# Patient Record
Sex: Female | Born: 1980 | Race: White | Hispanic: No | Smoking: Current every day smoker
Health system: Southern US, Community
[De-identification: ages and names within clinical notes are randomized; demographics above are authoritative.]

## PROBLEM LIST (undated history)

## (undated) DIAGNOSIS — G8929 Other chronic pain: Secondary | ICD-10-CM

## (undated) DIAGNOSIS — T7840XA Allergy, unspecified, initial encounter: Secondary | ICD-10-CM

## (undated) DIAGNOSIS — I10 Essential (primary) hypertension: Secondary | ICD-10-CM

## (undated) DIAGNOSIS — F419 Anxiety disorder, unspecified: Secondary | ICD-10-CM

## (undated) DIAGNOSIS — R519 Headache, unspecified: Secondary | ICD-10-CM

## (undated) DIAGNOSIS — D649 Anemia, unspecified: Secondary | ICD-10-CM

## (undated) HISTORY — DX: Headache, unspecified: R51.9

## (undated) HISTORY — DX: Other chronic pain: G89.29

## (undated) HISTORY — DX: Anxiety disorder, unspecified: F41.9

## (undated) HISTORY — DX: Allergy, unspecified, initial encounter: T78.40XA

## (undated) HISTORY — PX: FRACTURE SURGERY: SHX138

## (undated) HISTORY — DX: Essential (primary) hypertension: I10

---
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
## 2011-05-08 ENCOUNTER — Encounter: Payer: Self-pay | Admitting: *Deleted

## 2011-05-08 ENCOUNTER — Emergency Department (HOSPITAL_COMMUNITY)
Admission: EM | Admit: 2011-05-08 | Discharge: 2011-05-08 | Disposition: A | Discharge status: Home / Self Care | Payer: Self-pay | Attending: Emergency Medicine | Admitting: Emergency Medicine

## 2011-05-08 DIAGNOSIS — H53149 Visual discomfort, unspecified: Secondary | ICD-10-CM | POA: Insufficient documentation

## 2011-05-08 DIAGNOSIS — R110 Nausea: Secondary | ICD-10-CM | POA: Insufficient documentation

## 2011-05-08 DIAGNOSIS — G43909 Migraine, unspecified, not intractable, without status migrainosus: Secondary | ICD-10-CM | POA: Insufficient documentation

## 2011-05-08 MED ORDER — DEXAMETHASONE SODIUM PHOSPHATE 10 MG/ML IJ SOLN: 10.0000 mg | Freq: Once | INTRAMUSCULAR | Status: DC

## 2011-05-08 MED ORDER — METOCLOPRAMIDE HCL 5 MG/ML IJ SOLN: 10.0000 mg | Freq: Once | INTRAMUSCULAR | Status: DC

## 2011-05-08 MED ORDER — OXYCODONE-ACETAMINOPHEN 5-325 MG PO TABS
1.0000 | ORAL_TABLET | Freq: Once | ORAL | Status: AC
  Administered 2011-05-08: 1 via ORAL
  Filled 2011-05-08: qty 1

## 2011-05-08 MED ORDER — DIPHENHYDRAMINE HCL 50 MG/ML IJ SOLN: 25.0000 mg | Freq: Once | INTRAMUSCULAR | Status: DC

## 2011-05-08 MED ORDER — KETOROLAC TROMETHAMINE 60 MG/2ML IM SOLN
60.0000 mg | Freq: Once | INTRAMUSCULAR | Status: AC
  Administered 2011-05-08: 60 mg via INTRAMUSCULAR
  Filled 2011-05-08 (×2): qty 2

## 2011-05-08 MED ORDER — SUMATRIPTAN SUCCINATE 100 MG PO TABS: ORAL_TABLET | ORAL | Status: AC

## 2011-05-08 MED ORDER — METOCLOPRAMIDE HCL 5 MG/ML IJ SOLN
10.0000 mg | Freq: Once | INTRAMUSCULAR | Status: AC
  Administered 2011-05-08: 10 mg via INTRAMUSCULAR
  Filled 2011-05-08: qty 2

## 2011-05-08 NOTE — ED Notes (Signed)
Pt up to use the bathroom. Gait was steady.

## 2011-05-08 NOTE — ED Notes (Signed)
Iv team not able to get iv access either. Asa Lente, PA made aware.

## 2011-05-08 NOTE — ED Notes (Signed)
Patient with right sided migraine headache for about 3 days.  Tried advil without any relief.  Last dose was about 4 hours ago

## 2011-05-08 NOTE — ED Provider Notes (Signed)
History     CSN: [id number]  Arrival date & time 05/08/11  1530   First MD Initiated Contact with Patient 05/08/11 1605      Chief Complaint  Patient presents with  . Migraine    (Consider location/radiation/quality/duration/timing/severity/associated sxs/prior treatment) Patient is a 30 y.o. female presenting with migraine. The history is provided by the patient.  Migraine This is a recurrent problem. The current episode started today. The problem occurs constantly. The problem has been unchanged. Associated symptoms include headaches and nausea. Pertinent negatives include no chills, fever or vomiting. Associated symptoms comments: She has a history of migraines that is similar to current headache, located over the right eye.. Exacerbated bySherlynn Stalls makes the headache worse.    History reviewed. No pertinent past medical history.  History reviewed. No pertinent past surgical history.  History reviewed. No pertinent family history.  History  Substance Use Topics  . Smoking status: Never Smoker   . Smokeless tobacco: Not on file  . Alcohol Use: No    OB History    Grav Para Term Preterm Abortions TAB SAB Ect Mult Living                  Review of Systems  Constitutional: Negative for fever and chills.  Eyes: Positive for photophobia.  Respiratory: Negative.   Cardiovascular: Negative.   Gastrointestinal: Positive for nausea. Negative for vomiting.  Musculoskeletal: Negative.   Skin: Negative.   Neurological: Positive for headaches.    Allergies  Review of patient's allergies indicates no known allergies.  Home Medications   Current Outpatient Rx  Name Route Sig Dispense Refill  . IBUPROFEN 200 MG PO TABS Oral Take 200 mg by mouth every 6 (six) hours as needed. For headache       BP 122/66  Pulse 106  Temp(Src) 98.2 F (36.8 C) (Oral)  Resp 2  SpO2 99%  Physical Exam  Constitutional: She is oriented to person, place, and time. She appears  well-developed and well-nourished.  HENT:  Head: Normocephalic.  Eyes: Pupils are equal, round, and reactive to light.  Neck: Normal range of motion. Neck supple.  Cardiovascular: Normal rate and regular rhythm.   Pulmonary/Chest: Effort normal and breath sounds normal.  Abdominal: Soft. Bowel sounds are normal. There is no tenderness. There is no rebound and no guarding.  Musculoskeletal: Normal range of motion.  Neurological: She is alert and oriented to person, place, and time. She has normal strength and normal reflexes. No cranial nerve deficit or sensory deficit. She displays a negative Romberg sign. Coordination normal.  Skin: Skin is warm and dry. No rash noted.  Psychiatric: She has a normal mood and affect.    ED Course  Procedures (including critical care time)  Labs Reviewed - No data to display No results found.   No diagnosis found.    MDM  Pain is no better with IM reglan and toradol. Percocet with relief of headache.        Rodena Medin, PA 05/08/11 [phone]

## 2011-05-08 NOTE — ED Provider Notes (Signed)
Medical screening examination/treatment/procedure(s) were performed by non-physician practitioner and as supervising physician I was immediately available for consultation/collaboration.   Andrew King, MD 05/08/11 2003 

## 2011-05-08 NOTE — ED Notes (Signed)
Paged iv team to come and start iv. Attempted x 2 without success.

## 2018-10-12 ENCOUNTER — Other Ambulatory Visit: Payer: Self-pay

## 2018-10-12 ENCOUNTER — Encounter (HOSPITAL_COMMUNITY): Payer: Self-pay | Admitting: Emergency Medicine

## 2018-10-12 ENCOUNTER — Emergency Department (HOSPITAL_COMMUNITY)
Admission: EM | Admit: 2018-10-12 | Discharge: 2018-10-12 | Disposition: A | Discharge status: Home / Self Care | Payer: Medicaid - Out of State | Attending: Emergency Medicine | Admitting: Emergency Medicine

## 2018-10-12 DIAGNOSIS — F17210 Nicotine dependence, cigarettes, uncomplicated: Secondary | ICD-10-CM | POA: Insufficient documentation

## 2018-10-12 DIAGNOSIS — N938 Other specified abnormal uterine and vaginal bleeding: Secondary | ICD-10-CM | POA: Diagnosis not present

## 2018-10-12 DIAGNOSIS — N939 Abnormal uterine and vaginal bleeding, unspecified: Secondary | ICD-10-CM | POA: Diagnosis present

## 2018-10-12 DIAGNOSIS — I10 Essential (primary) hypertension: Secondary | ICD-10-CM | POA: Insufficient documentation

## 2018-10-12 HISTORY — DX: Essential (primary) hypertension: I10

## 2018-10-12 LAB — URINALYSIS, ROUTINE W REFLEX MICROSCOPIC
Bacteria, UA: NONE SEEN
Bilirubin Urine: NEGATIVE
Glucose, UA: NEGATIVE mg/dL
Ketones, ur: NEGATIVE mg/dL
Leukocytes,Ua: NEGATIVE
Nitrite: NEGATIVE
Protein, ur: NEGATIVE mg/dL
Specific Gravity, Urine: 1.002 — ABNORMAL LOW (ref 1.005–1.030)
pH: 6 (ref 5.0–8.0)

## 2018-10-12 LAB — CBC WITH DIFFERENTIAL/PLATELET
Abs Immature Granulocytes: 0.02 10*3/uL (ref 0.00–0.07)
Basophils Absolute: 0 10*3/uL (ref 0.0–0.1)
Basophils Relative: 0 %
Eosinophils Absolute: 0.2 10*3/uL (ref 0.0–0.5)
Eosinophils Relative: 3 %
HCT: 34.4 % — ABNORMAL LOW (ref 36.0–46.0)
Hemoglobin: 10.8 g/dL — ABNORMAL LOW (ref 12.0–15.0)
Immature Granulocytes: 0 %
Lymphocytes Relative: 29 %
Lymphs Abs: 2.3 10*3/uL (ref 0.7–4.0)
MCH: 23.9 pg — ABNORMAL LOW (ref 26.0–34.0)
MCHC: 31.4 g/dL (ref 30.0–36.0)
MCV: 76.3 fL — ABNORMAL LOW (ref 80.0–100.0)
Monocytes Absolute: 0.6 10*3/uL (ref 0.1–1.0)
Monocytes Relative: 7 %
Neutro Abs: 4.8 10*3/uL (ref 1.7–7.7)
Neutrophils Relative %: 61 %
Platelets: 336 10*3/uL (ref 150–400)
RBC: 4.51 MIL/uL (ref 3.87–5.11)
RDW: 17.7 % — ABNORMAL HIGH (ref 11.5–15.5)
WBC: 7.9 10*3/uL (ref 4.0–10.5)
nRBC: 0 % (ref 0.0–0.2)

## 2018-10-12 LAB — PREGNANCY, URINE: Preg Test, Ur: NEGATIVE

## 2018-10-12 LAB — BASIC METABOLIC PANEL
Anion gap: 10 (ref 5–15)
BUN: 12 mg/dL (ref 6–20)
CO2: 25 mmol/L (ref 22–32)
Calcium: 8.9 mg/dL (ref 8.9–10.3)
Chloride: 102 mmol/L (ref 98–111)
Creatinine, Ser: 0.8 mg/dL (ref 0.44–1.00)
GFR calc Af Amer: >60 mL/min (ref >60)
GFR calc non Af Amer: >60 mL/min (ref >60)
Glucose, Bld: 95 mg/dL (ref 70–99)
Potassium: 4 mmol/L (ref 3.5–5.1)
Sodium: 137 mmol/L (ref 135–145)

## 2018-10-12 LAB — HCG, QUANTITATIVE, PREGNANCY: hCG, Beta Chain, Quant, S: <1 m[IU]/mL (ref <5)

## 2018-10-12 NOTE — Discharge Instructions (Addendum)
You were seen today for dysfunctional uterine bleeding.  Your hemoglobin was 10.2.  Continue to monitor your bleeding carefully.  If you have greater than 4 hours of bleeding through 1 pad per hour, develop dizziness, syncope, any new or worsening symptoms you need to be reevaluated.  Follow-up closely with your OB/GYN.  Continue iron.

## 2018-10-12 NOTE — ED Provider Notes (Signed)
[facility] EMERGENCY DEPARTMENT Provider Note   CSN: [id number] Arrival date & time: 10/12/18  0140    History   Chief Complaint Chief Complaint  Patient presents with  . Vaginal Bleeding    HPI Marissa Serrano is a 38 y.o. female.     HPI  This is a 38 year old female with a history of vaginal bleeding, anemia, fibroids who presents with vaginal bleeding.  Patient reports that she has had worsening vaginal bleeding since January 2019.  She has had multiple blood transfusions and surgery to remove fibroids.  Patient reports that she has been bleeding for the last 3 to 4 days.  Today she has noted large clots and is soaking a pad per hour.  She reports some dizziness and "seeing black spots."  She takes iron.  She is unsure of whether she may be pregnant.  She reports some crampy lower abdominal pain that radiates into her back.  Denies dysuria.  Rates her pain at 4 out of 10.  Past Medical History:  Diagnosis Date  . Anemia 1991  . Hypertension     There are no active problems to display for this patient.   History reviewed. No pertinent surgical history.   OB History   No obstetric history on file.      Home Medications    Prior to Admission medications   Medication Sig Start Date End Date Taking? Authorizing Provider  ibuprofen (ADVIL,MOTRIN) 200 MG tablet Take 200 mg by mouth every 6 (six) hours as needed. For headache     [provider]  SUMAtriptan (IMITREX) 100 MG tablet One tablet at onset of headache. May repeat x 1 in 2 hours if no relief. Maximum of 200 mg in 24 hours. 05/08/11   Elpidio Anis, PA-C    Family History History reviewed. No pertinent family history.  Social History Social History   Tobacco Use  . Smoking status: Current Every Day Smoker    Packs/day: 0.50    Years: 15.00    Pack years: 7.50    Types: Cigarettes  . Smokeless tobacco: Never Used  Substance Use Topics  . Alcohol use: No  . Drug use: Not Currently      Allergies   Patient has no known allergies.   Review of Systems Review of Systems  Constitutional: Negative for fever.  Eyes: Positive for visual disturbance.  Respiratory: Negative for shortness of breath.   Cardiovascular: Negative for chest pain.  Gastrointestinal: Positive for abdominal pain. Negative for nausea and vomiting.  Genitourinary: Positive for vaginal bleeding. Negative for dysuria.  Neurological: Positive for dizziness.  All other systems reviewed and are negative.    Physical Exam Updated Vital Signs BP (!) 143/83   Pulse 75   Temp 98.8 F (37.1 C) (Oral)   Resp 16   Ht 1.626 m (5\' 4" )   Wt 74.8 kg   LMP 10/12/2018   SpO2 99%   BMI 28.32 kg/m   Physical Exam Vitals signs and nursing note reviewed.  Constitutional:      Appearance: She is well-developed. She is not ill-appearing.  HENT:     Head: Normocephalic and atraumatic.     Mouth/Throat:     Mouth: Mucous membranes are moist.  Eyes:     Pupils: Pupils are equal, round, and reactive to light.  Neck:     Musculoskeletal: Neck supple.  Cardiovascular:     Rate and Rhythm: Normal rate and regular rhythm.  Pulmonary:     Effort:  Pulmonary effort is normal. No respiratory distress.  Abdominal:     General: Bowel sounds are normal.     Palpations: Abdomen is soft.     Tenderness: There is no abdominal tenderness. There is no guarding or rebound.  Genitourinary:    Comments: Normal external vaginal exam, moderate blood noted in the vaginal vault, no clots noted, cervical office closed Musculoskeletal:     Right lower leg: No edema.     Left lower leg: No edema.  Skin:    General: Skin is warm and dry.  Neurological:     Mental Status: She is alert and oriented to person, place, and time.  Psychiatric:        Mood and Affect: Mood normal.      ED Treatments / Results  Labs (all labs ordered are listed, but only abnormal results are displayed) Labs Reviewed  URINALYSIS, ROUTINE W  REFLEX MICROSCOPIC - Abnormal; Notable for the following components:      Result Value   Color, Urine COLORLESS (*)    Specific Gravity, Urine 1.002 (*)    Hgb urine dipstick LARGE (*)    All other components within normal limits  CBC WITH DIFFERENTIAL/PLATELET - Abnormal; Notable for the following components:   Hemoglobin 10.8 (*)    HCT 34.4 (*)    MCV 76.3 (*)    MCH 23.9 (*)    RDW 17.7 (*)    All other components within normal limits  PREGNANCY, URINE  BASIC METABOLIC PANEL  HCG, QUANTITATIVE, PREGNANCY    EKG None  Radiology No results found.  Procedures Procedures (including critical care time)  Medications Ordered in ED Medications - No data to display   Initial Impression / Assessment and Plan / ED Course  I have reviewed the triage vital signs and the nursing notes.  Pertinent labs & imaging results that were available during my care of the patient were reviewed by me and considered in my medical decision making (see chart for details).        Patient presents with vaginal bleeding.  Reports history of the same requiring blood transfusion.  She is overall nontoxic-appearing and vital signs are reassuring.  She is not orthostatic.  She has some bleeding on exam but no clots.  Hemoglobin today is 10.8.  She does not meet requirements for transfusion.  Otherwise her work-up is reassuring.  Recommend that she closely continue to monitor her bleeding.  Follow-up with OB/GYN on Monday.  Continue iron.  After history, exam, and medical workup I feel the patient has been appropriately medically screened and is safe for discharge home. Pertinent diagnoses were discussed with the patient. Patient was given return precautions.   Final Clinical Impressions(s) / ED Diagnoses   Final diagnoses:  Dysfunctional uterine bleeding    ED Discharge Orders    None       Shon BatonHorton, Ridhaan Dreibelbis F, MD 10/12/18 [phone]

## 2018-10-12 NOTE — ED Triage Notes (Signed)
Pt reports she has hx of fibroids and has had surgery for same, pt reports she has intermittent periods of vaginal bleeding since Jan 2019 and had had to have 2 blood transfusions due to severe vaginal bleeding, pt states she saturates 1 peri pad hourly x 4 hours with large clots, pt c/o mid lower abd pain that radiates into her back

## 2019-03-17 ENCOUNTER — Other Ambulatory Visit: Payer: Self-pay

## 2019-03-17 ENCOUNTER — Encounter (HOSPITAL_COMMUNITY): Payer: Self-pay

## 2019-03-17 ENCOUNTER — Emergency Department (HOSPITAL_COMMUNITY): Payer: Medicaid - Out of State

## 2019-03-17 ENCOUNTER — Emergency Department (HOSPITAL_COMMUNITY)
Admission: EM | Admit: 2019-03-17 | Discharge: 2019-03-17 | Disposition: A | Discharge status: Home / Self Care | Payer: Medicaid - Out of State | Attending: Emergency Medicine | Admitting: Emergency Medicine

## 2019-03-17 DIAGNOSIS — I10 Essential (primary) hypertension: Secondary | ICD-10-CM | POA: Insufficient documentation

## 2019-03-17 DIAGNOSIS — F17210 Nicotine dependence, cigarettes, uncomplicated: Secondary | ICD-10-CM | POA: Diagnosis not present

## 2019-03-17 DIAGNOSIS — R072 Precordial pain: Secondary | ICD-10-CM | POA: Diagnosis not present

## 2019-03-17 DIAGNOSIS — R0789 Other chest pain: Secondary | ICD-10-CM | POA: Diagnosis present

## 2019-03-17 LAB — BASIC METABOLIC PANEL
Anion gap: 11 (ref 5–15)
BUN: 10 mg/dL (ref 6–20)
CO2: 24 mmol/L (ref 22–32)
Calcium: 9 mg/dL (ref 8.9–10.3)
Chloride: 104 mmol/L (ref 98–111)
Creatinine, Ser: 0.68 mg/dL (ref 0.44–1.00)
GFR calc Af Amer: >60 mL/min (ref >60)
GFR calc non Af Amer: >60 mL/min (ref >60)
Glucose, Bld: 88 mg/dL (ref 70–99)
Potassium: 3.6 mmol/L (ref 3.5–5.1)
Sodium: 139 mmol/L (ref 135–145)

## 2019-03-17 LAB — I-STAT BETA HCG BLOOD, ED (MC, WL, AP ONLY): I-stat hCG, quantitative: <5 m[IU]/mL (ref <5)

## 2019-03-17 LAB — TROPONIN I (HIGH SENSITIVITY)
Troponin I (High Sensitivity): 2 ng/L (ref <18)
Troponin I (High Sensitivity): 2 ng/L (ref <18)

## 2019-03-17 LAB — CBC
HCT: 35.9 % — ABNORMAL LOW (ref 36.0–46.0)
Hemoglobin: 10.9 g/dL — ABNORMAL LOW (ref 12.0–15.0)
MCH: 24.3 pg — ABNORMAL LOW (ref 26.0–34.0)
MCHC: 30.4 g/dL (ref 30.0–36.0)
MCV: 80 fL (ref 80.0–100.0)
Platelets: 368 10*3/uL (ref 150–400)
RBC: 4.49 MIL/uL (ref 3.87–5.11)
RDW: 17.5 % — ABNORMAL HIGH (ref 11.5–15.5)
WBC: 9.1 10*3/uL (ref 4.0–10.5)
nRBC: 0 % (ref 0.0–0.2)

## 2019-03-17 LAB — TSH: TSH: 2.328 u[IU]/mL (ref 0.350–4.500)

## 2019-03-17 MED ORDER — ASPIRIN 81 MG PO CHEW: 324.0000 mg | CHEWABLE_TABLET | Freq: Once | ORAL | Status: DC

## 2019-03-17 MED ORDER — LISINOPRIL 10 MG PO TABS: 10.0000 mg | ORAL_TABLET | Freq: Every day | ORAL | 0 refills | Status: DC

## 2019-03-17 NOTE — ED Notes (Signed)
Lab in room.

## 2019-03-17 NOTE — ED Notes (Addendum)
Pt did not want ntg given by ems due to migraine hx

## 2019-03-17 NOTE — ED Provider Notes (Signed)
[facility] EMERGENCY DEPARTMENT Provider Note   CSN: [id number] Arrival date & time: 03/17/19  1957     History   Chief Complaint Chief Complaint  Patient presents with  . Chest Pain    HPI Marissa Serrano is a 38 y.o. female who presents emergency department chief complaint of chest pressure and shortness of breath.  Patient has a past medical history of chronic anemia.  She states that she has had it since she would about 38 years old.  She has a history of persistent ice pica.  Patient says that over the past 4 days she has had a sensation of heavy chest pressure, feeling short of breath which is worse with exertion.  She states that she was recently diagnosed with bleeding ovarian cyst and had heavy blood loss.  She had one of her cysts removed in [place] and is scheduled for another one.  She denies unilateral leg swelling.  She is a daily smoker.  She denies use of exogenous estrogens, recent confinement or injury.  She has no history of primary relative with MI.  She denies feelings of lightheadedness, orthopnea or PND.  She denies pleuritic chest pain.  She was seen at an urgent care and found to have T wave inversion was given baby aspirin and sent by EMS to be evaluated here.  She has also noted that her blood pressure has been elevated ever since she started having problems with her ovarian cyst.  Denies fevers, chills, cough.     HPI  Past Medical History:  Diagnosis Date  . Anemia 1991  . Hypertension     There are no active problems to display for this patient.   History reviewed. No pertinent surgical history.   OB History   No obstetric history on file.      Home Medications    Prior to Admission medications   Medication Sig Start Date End Date Taking? Authorizing Provider  ibuprofen (ADVIL,MOTRIN) 200 MG tablet Take 200 mg by mouth every 6 (six) hours as needed. For headache     [provider]  SUMAtriptan (IMITREX) 100 MG tablet One tablet at  onset of headache. May repeat x 1 in 2 hours if no relief. Maximum of 200 mg in 24 hours. 05/08/11   Charlann Lange, PA-C    Family History History reviewed. No pertinent family history.  Social History Social History   Tobacco Use  . Smoking status: Current Every Day Smoker    Packs/day: 0.50    Years: 15.00    Pack years: 7.50    Types: Cigarettes  . Smokeless tobacco: Never Used  Substance Use Topics  . Alcohol use: No  . Drug use: Not Currently     Allergies   Patient has no known allergies.   Review of Systems Review of Systems Ten systems reviewed and are negative for acute change, except as noted in the HPI.    Physical Exam Updated Vital Signs BP (!) 160/102   Pulse 71   Temp 99 F (37.2 C) (Oral)   Resp 19   Ht 5\' 4"  (1.626 m)   Wt 74.4 kg   LMP 02/20/2019 (Exact Date)   SpO2 100%   BMI 28.15 kg/m   Physical Exam Vitals signs and nursing note reviewed.  Constitutional:      General: She is not in acute distress.    Appearance: She is well-developed. She is not diaphoretic.  HENT:     Head: Normocephalic and atraumatic.  Eyes:     General: No scleral icterus.    Conjunctiva/sclera: Conjunctivae normal.  Neck:     Musculoskeletal: Normal range of motion.     Vascular: No JVD.  Cardiovascular:     Rate and Rhythm: Normal rate and regular rhythm.     Heart sounds: Normal heart sounds. No murmur. No friction rub. No gallop.   Pulmonary:     Effort: Pulmonary effort is normal. No respiratory distress.     Breath sounds: Normal breath sounds.  Abdominal:     General: Bowel sounds are normal. There is no distension.     Palpations: Abdomen is soft. There is no mass.     Tenderness: There is no abdominal tenderness. There is no guarding.  Musculoskeletal:     Right lower leg: No edema.     Left lower leg: No edema.  Skin:    General: Skin is warm and dry.  Neurological:     Mental Status: She is alert and oriented to person, place, and time.   Psychiatric:        Behavior: Behavior normal.      ED Treatments / Results  Labs (all labs ordered are listed, but only abnormal results are displayed) Labs Reviewed  BASIC METABOLIC PANEL  CBC  I-STAT BETA HCG BLOOD, ED ([facility], [facility], [facility] ONLY)  TROPONIN I (HIGH SENSITIVITY)    EKG EKG Interpretation  Date/Time:  Tuesday March 17 2019 20:02:58 EST Ventricular Rate:  70 PR Interval:    QRS Duration: 86 QT Interval:  395 QTC Calculation: 427 R Axis:   72 Text Interpretation: Sinus rhythm Normal ECG Confirmed by Eber Hong ([id number]) on 03/17/2019 8:40:34 PM   Radiology No results found.  Procedures Procedures (including critical care time)  Medications Ordered in ED Medications - No data to display   Initial Impression / Assessment and Plan / ED Course  I have reviewed the triage vital signs and the nursing notes.  Pertinent labs & imaging results that were available during my care of the patient were reviewed by me and considered in my medical decision making (see chart for details).        38 year old female here with complaint of chest pressure and shortness of breath.  She is PERC negative. Differential diagnosis includes hypertension, cardiomyopathy.  I have low suspicion for ACS and her heart score still pending given her initial value of troponin.  Personally reviewed the patient's 1 view chest x-ray which appears within normal limits, without evidence of cardiomegaly or edema my interpretation.  Patient also denies orthopnea or PND suggestive of CHF diagnosis.  She has a TSH pending to rule out hyperthyroidism although she does not have tachycardia here.  She has no previous history of hypertension states that this just began when she started having her ovarian cyst issues.  Patient EKG shows normal sinus rhythm at a rate of 70 without any evidence of ischemic change.  I have given sign out PA Triplett who will assume care of the patient  Final Clinical  Impressions(s) / ED Diagnoses   Final diagnoses:  None    ED Discharge Orders    None       Arthor Captain, PA-C 03/17/19 2202    Eber Hong, MD 03/18/19 1501

## 2019-03-17 NOTE — ED Notes (Signed)
Called lab regarding bloodwork

## 2019-03-17 NOTE — ED Provider Notes (Signed)
   38 year old with 4-day history of chest heaviness and pressure that has been constant since onset.  Cardiac work-up pending and patient signed out to me by Margarita Mail, PA-C at end of shift.   Initial troponin is reassuring as well as TSH.  Chest x-ray shows no evidence of cardiomegaly.  Vital signs reviewed.  She is PERC negative and has been without tachypnea tachycardia or hypoxia while here.  She has been hypertensive and endorses history of this for approximately 1 year after a diagnosis of ovarian cyst issues   Labs Reviewed  CBC - Abnormal; Notable for the following components:      Result Value   Hemoglobin 10.9 (*)    HCT 35.9 (*)    MCH 24.3 (*)    RDW 17.5 (*)    All other components within normal limits  BASIC METABOLIC PANEL  TSH  I-STAT BETA HCG BLOOD, ED ([facility], [facility], [facility] ONLY)  TROPONIN I (HIGH SENSITIVITY)  TROPONIN I (HIGH SENSITIVITY)   Delta trop is unchanged.    HEART score of 2.  Pt agrees to d/c home.  I will provide f/u referral info regarding her HTN and start her on an ACE inhibitor.  she agrees to plan.  Advised her of side effect risks and she verbalized Therisa Doyne, PA-C 03/17/19 2333    Noemi Chapel, MD 03/18/19 1501

## 2019-03-17 NOTE — ED Triage Notes (Signed)
Ems gave 3 baby aspirin in route.

## 2019-03-17 NOTE — Discharge Instructions (Addendum)
Your work-up this evening was reassuring.  I have provided local clinic for you to establish primary care regarding your hypertension.  Return to the ER for any worsening symptoms.

## 2019-03-17 NOTE — ED Triage Notes (Addendum)
Pt sent from [facility] for chest pain x4 days. Stated there was a "t wave abnormality"  EMS said that she has an inverted t wave on their monitor.  8/10 center chest  Pt also has hx of htn .

## 2020-07-11 ENCOUNTER — Ambulatory Visit: Payer: Self-pay | Admitting: Family Medicine

## 2021-03-07 ENCOUNTER — Encounter: Payer: Self-pay | Admitting: Family Medicine

## 2021-03-07 ENCOUNTER — Other Ambulatory Visit: Payer: Self-pay

## 2021-03-07 ENCOUNTER — Ambulatory Visit (INDEPENDENT_AMBULATORY_CARE_PROVIDER_SITE_OTHER): Payer: Managed Care, Other (non HMO) | Admitting: Family Medicine

## 2021-03-07 VITALS — BP 162/100 | HR 76 | Temp 97.7°F | Ht 64.0 in | Wt 166.0 lb

## 2021-03-07 DIAGNOSIS — I10 Essential (primary) hypertension: Secondary | ICD-10-CM

## 2021-03-07 DIAGNOSIS — Z6828 Body mass index (BMI) 28.0-28.9, adult: Secondary | ICD-10-CM

## 2021-03-07 DIAGNOSIS — F339 Major depressive disorder, recurrent, unspecified: Secondary | ICD-10-CM

## 2021-03-07 MED ORDER — LISINOPRIL 40 MG PO TABS: 40.0000 mg | ORAL_TABLET | Freq: Every day | ORAL | 3 refills | Status: DC

## 2021-03-07 MED ORDER — CLONIDINE HCL 0.1 MG PO TABS
0.1000 mg | ORAL_TABLET | Freq: Once | ORAL | Status: AC
  Administered 2021-03-07: 0.1 mg via ORAL

## 2021-03-07 MED ORDER — CLONIDINE HCL 0.1 MG PO TABS: 0.1000 mg | ORAL_TABLET | Freq: Three times a day (TID) | ORAL | 3 refills | Status: AC | PRN

## 2021-03-07 MED ORDER — FLUOXETINE HCL 20 MG PO CAPS: 20.0000 mg | ORAL_CAPSULE | Freq: Every day | ORAL | 3 refills | Status: AC

## 2021-03-07 NOTE — Patient Instructions (Addendum)
If your symptoms worsen or you have thoughts of suicide/homicide, PLEASE SEEK IMMEDIATE MEDICAL ATTENTION.  You may always call the National Suicide Hotline.  This is available 24 hours a day, 7 days a week.  Their number is: 203-223-6940  Taking the medicine as directed and not missing any doses is one of the best things you can do to treat your depression.  Here are some things to keep in mind:  Side effects (stomach upset, some increased anxiety) may happen before you notice a benefit.  These side effects typically go away over time. Changes to your dose of medicine or a change in medication all together is sometimes necessary Most people need to be on medication at least 12 months Many people will notice an improvement within two weeks but the full effect of the medication can take up to 4-6 weeks Stopping the medication when you start feeling better often results in a return of symptoms Never discontinue your medication without contacting a health care professional first.  Some medications require gradual discontinuation/ taper and can make you sick if you stop them abruptly.  If your symptoms worsen or you have thoughts of suicide/homicide, PLEASE SEEK IMMEDIATE MEDICAL ATTENTION.  You may always call:  National Suicide Hotline: 724 185 9284 [place] Crisis Line: 5861792337 Crisis Recovery in [place]: 909-311-5016  These are available 24 hours a day, 7 days a week.   Goal BP:  For patients younger than 60: Goal BP < 140/90. For patients 60 and older: Goal BP < 150/90. For patients with diabetes: Goal BP < 140/90.  Take your medications faithfully as prescribed. Maintain a healthy weight. Get at least 150 minutes of aerobic exercise per week. Minimize salt intake, less than 2000 mg per day. Minimize alcohol intake.  DASH Eating Plan DASH stands for "Dietary Approaches to Stop Hypertension." The DASH eating plan is a healthy eating plan that has been shown to  reduce high blood pressure (hypertension). Additional health benefits may include reducing the risk of type 2 diabetes mellitus, heart disease, and stroke. The DASH eating plan may also help with weight loss.  WHAT DO I NEED TO KNOW ABOUT THE DASH EATING PLAN? For the DASH eating plan, you will follow these general guidelines: Choose foods with a percent daily value for sodium of less than 5% (as listed on the food label). Use salt-free seasonings or herbs instead of table salt or sea salt. Check with your health care provider or pharmacist before using salt substitutes. Eat lower-sodium products, often labeled as "lower sodium" or "no salt added." Eat fresh foods. Eat more vegetables, fruits, and low-fat dairy products. Choose whole grains. Look for the word "whole" as the first word in the ingredient list. Choose fish and skinless chicken or [place] more often than red meat. Limit fish, poultry, and meat to 6 oz (170 g) each day. Limit sweets, desserts, sugars, and sugary drinks. Choose heart-healthy fats. Limit cheese to 1 oz (28 g) per day. Eat more home-cooked food and less restaurant, buffet, and fast food. Limit fried foods. Cook foods using methods other than frying. Limit canned vegetables. If you do use them, rinse them well to decrease the sodium. When eating at a restaurant, ask that your food be prepared with less salt, or no salt if possible.  WHAT FOODS CAN I EAT? Seek help from a dietitian for individual calorie needs.  Grains Whole grain or whole wheat bread. Brown rice. Whole grain or whole wheat pasta. Quinoa, bulgur, and whole  grain cereals. Low-sodium cereals. Corn or whole wheat flour tortillas. Whole grain cornbread. Whole grain crackers. Low-sodium crackers.  Vegetables Fresh or frozen vegetables (raw, steamed, roasted, or grilled). Low-sodium or reduced-sodium tomato and vegetable juices. Low-sodium or reduced-sodium tomato sauce and paste. Low-sodium or  reduced-sodium canned vegetables.   Fruits All fresh, canned (in natural juice), or frozen fruits.  Meat and Other Protein Products Ground beef (85% or leaner), grass-fed beef, or beef trimmed of fat. Skinless chicken or [place]. Ground chicken or [place]. Pork trimmed of fat. All fish and seafood. Eggs. Dried beans, peas, or lentils. Unsalted nuts and seeds. Unsalted canned beans.  Dairy Low-fat dairy products, such as skim or 1% milk, 2% or reduced-fat cheeses, low-fat ricotta or cottage cheese, or plain low-fat yogurt. Low-sodium or reduced-sodium cheeses.  Fats and Oils Tub margarines without trans fats. Light or reduced-fat mayonnaise and salad dressings (reduced sodium). Avocado. Safflower, olive, or canola oils. Natural peanut or almond butter.  Other Unsalted popcorn and pretzels. The items listed above may not be a complete list of recommended foods or beverages. Contact your dietitian for more options.  WHAT FOODS ARE NOT RECOMMENDED?  Grains White bread. White pasta. White rice. Refined cornbread. Bagels and croissants. Crackers that contain trans fat.  Vegetables Creamed or fried vegetables. Vegetables in a cheese sauce. Regular canned vegetables. Regular canned tomato sauce and paste. Regular tomato and vegetable juices.  Fruits Dried fruits. Canned fruit in light or heavy syrup. Fruit juice.  Meat and Other Protein Products Fatty cuts of meat. Ribs, chicken wings, bacon, sausage, bologna, salami, chitterlings, fatback, hot dogs, bratwurst, and packaged luncheon meats. Salted nuts and seeds. Canned beans with salt.  Dairy Whole or 2% milk, cream, half-and-half, and cream cheese. Whole-fat or sweetened yogurt. Full-fat cheeses or blue cheese. Nondairy creamers and whipped toppings. Processed cheese, cheese spreads, or cheese curds.  Condiments Onion and garlic salt, seasoned salt, table salt, and sea salt. Canned and packaged gravies. Worcestershire sauce. Tartar sauce.  Barbecue sauce. Teriyaki sauce. Soy sauce, including reduced sodium. Steak sauce. Fish sauce. Oyster sauce. Cocktail sauce. Horseradish. Ketchup and mustard. Meat flavorings and tenderizers. Bouillon cubes. Hot sauce. Tabasco sauce. Marinades. Taco seasonings. Relishes.  Fats and Oils Butter, stick margarine, lard, shortening, ghee, and bacon fat. Coconut, palm kernel, or palm oils. Regular salad dressings.  Other Pickles and olives. Salted popcorn and pretzels.  The items listed above may not be a complete list of foods and beverages to avoid. Contact your dietitian for more information.  WHERE CAN I FIND MORE INFORMATION? National Heart, Lung, and Blood Institute: [email or web address] Document Released: 04/19/2011 Document Revised: 09/14/2013 Document Reviewed: 03/04/2013 [facility] Patient Information 2015 [place], [place]. This information is not intended to replace advice given to you by your health care provider. Make sure you discuss any questions you have with your health care provider.   I think that you would greatly benefit from seeing a nutritionist.  If you are interested, please call Dr. Gerilyn Pilgrim at [phone] to schedule an appointment.
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
## 2021-03-08 LAB — CBC WITH DIFFERENTIAL/PLATELET
Basophils Absolute: 0 10*3/uL (ref 0.0–0.2)
Basos: 1 %
EOS (ABSOLUTE): 0.1 10*3/uL (ref 0.0–0.4)
Eos: 2 %
Hematocrit: 43.4 % (ref 34.0–46.6)
Hemoglobin: 13.7 g/dL (ref 11.1–15.9)
Immature Grans (Abs): 0 10*3/uL (ref 0.0–0.1)
Immature Granulocytes: 0 %
Lymphocytes Absolute: 2.2 10*3/uL (ref 0.7–3.1)
Lymphs: 32 %
MCH: 26.4 pg — ABNORMAL LOW (ref 26.6–33.0)
MCHC: 31.6 g/dL (ref 31.5–35.7)
MCV: 84 fL (ref 79–97)
Monocytes Absolute: 0.5 10*3/uL (ref 0.1–0.9)
Monocytes: 7 %
Neutrophils Absolute: 3.9 10*3/uL (ref 1.4–7.0)
Neutrophils: 58 %
Platelets: 345 10*3/uL (ref 150–450)
RBC: 5.18 x10E6/uL (ref 3.77–5.28)
RDW: 16.2 % — ABNORMAL HIGH (ref 11.7–15.4)
WBC: 6.6 10*3/uL (ref 3.4–10.8)

## 2021-03-08 LAB — LIPID PANEL
Chol/HDL Ratio: 4.3 ratio (ref 0.0–4.4)
Cholesterol, Total: 208 mg/dL — ABNORMAL HIGH (ref 100–199)
HDL: 48 mg/dL (ref >39)
LDL Chol Calc (NIH): 138 mg/dL — ABNORMAL HIGH (ref 0–99)
Triglycerides: 124 mg/dL (ref 0–149)
VLDL Cholesterol Cal: 22 mg/dL (ref 5–40)

## 2021-03-08 LAB — CMP14+EGFR
ALT: 15 IU/L (ref 0–32)
AST: 19 IU/L (ref 0–40)
Albumin/Globulin Ratio: 1.8 (ref 1.2–2.2)
Albumin: 4.6 g/dL (ref 3.8–4.8)
Alkaline Phosphatase: 89 IU/L (ref 44–121)
BUN/Creatinine Ratio: 9 (ref 9–23)
BUN: 8 mg/dL (ref 6–20)
Bilirubin Total: 0.4 mg/dL (ref 0.0–1.2)
CO2: 22 mmol/L (ref 20–29)
Calcium: 9.5 mg/dL (ref 8.7–10.2)
Chloride: 101 mmol/L (ref 96–106)
Creatinine, Ser: 0.86 mg/dL (ref 0.57–1.00)
Globulin, Total: 2.6 g/dL (ref 1.5–4.5)
Glucose: 91 mg/dL (ref 70–99)
Potassium: 4.1 mmol/L (ref 3.5–5.2)
Sodium: 138 mmol/L (ref 134–144)
Total Protein: 7.2 g/dL (ref 6.0–8.5)
eGFR: 89 mL/min/{1.73_m2} (ref >59)

## 2021-03-08 LAB — MICROALBUMIN / CREATININE URINE RATIO
Creatinine, Urine: 443.6 mg/dL
Microalb/Creat Ratio: 17 mg/g creat (ref 0–29)
Microalbumin, Urine: 75.6 ug/mL

## 2021-03-08 LAB — THYROID PANEL WITH TSH
Free Thyroxine Index: 3 (ref 1.2–4.9)
T3 Uptake Ratio: 26 % (ref 24–39)
T4, Total: 11.5 ug/dL (ref 4.5–12.0)
TSH: 1.67 u[IU]/mL (ref 0.450–4.500)

## 2021-03-21 ENCOUNTER — Other Ambulatory Visit: Payer: Self-pay

## 2021-03-21 ENCOUNTER — Ambulatory Visit (INDEPENDENT_AMBULATORY_CARE_PROVIDER_SITE_OTHER): Payer: Managed Care, Other (non HMO) | Admitting: Family Medicine

## 2021-03-21 ENCOUNTER — Encounter: Payer: Self-pay | Admitting: Family Medicine

## 2021-03-21 VITALS — BP 167/119 | HR 81 | Temp 97.6°F | Ht 64.0 in | Wt 170.0 lb

## 2021-03-21 DIAGNOSIS — B351 Tinea unguium: Secondary | ICD-10-CM | POA: Diagnosis not present

## 2021-03-21 DIAGNOSIS — I10 Essential (primary) hypertension: Secondary | ICD-10-CM

## 2021-03-21 MED ORDER — AMLODIPINE BESYLATE 5 MG PO TABS: 5.0000 mg | ORAL_TABLET | Freq: Every day | ORAL | 1 refills | Status: DC

## 2021-03-21 MED ORDER — TERBINAFINE HCL 250 MG PO TABS: 250.0000 mg | ORAL_TABLET | Freq: Every day | ORAL | 0 refills | Status: DC

## 2021-03-21 NOTE — Patient Instructions (Signed)

## 2021-03-21 NOTE — Progress Notes (Signed)
Subjective:  Patient ID: Marissa Serrano, female    DOB: [birth year], 40 y.o.   MRN: [id number]  Patient Care Team: Baruch Gouty, FNP as PCP - General (Family Medicine) Patient, No Pcp Per (Inactive) (General Practice)   Chief Complaint:  Hypertension   HPI: Marissa Serrano is a 40 y.o. female presenting on 03/21/2021 for Hypertension   Pt presents today for follow up of uncontrolled hypertension. She has been taking lisinopril 40 mg and HCTZ 25 mg daily. If BP is above 150/90 she will take 0.1 mg clonidine. She states she has taken the clonidine at least 5 times since prescribed. BP log reveals readings above goal on a consistent basis. She also reports discoloration of her right great toenail for several months. Has tried over the counter antifungals without relief of symptoms.   Hypertension This is a recurrent problem. The current episode started more than 1 month ago. The problem has been waxing and waning since onset. The problem is uncontrolled. Associated symptoms include anxiety and headaches. Pertinent negatives include no blurred vision, chest pain, malaise/fatigue, neck pain, orthopnea, palpitations, peripheral edema, PND, shortness of breath or sweats. Agents associated with hypertension include NSAIDs. Risk factors for coronary artery disease include stress and smoking/tobacco exposure. Past treatments include ACE inhibitors and diuretics. The current treatment provides mild improvement. Compliance problems include diet.     Relevant past medical, surgical, family, and social history reviewed and updated as indicated.  Allergies and medications reviewed and updated. Data reviewed: Chart in Epic.   Past Medical History:  Diagnosis Date   Allergy    Anemia 1991   Anxiety    Chronic headaches    Hypertension     Past Surgical History:  Procedure Laterality Date   FRACTURE SURGERY      Social History   Socioeconomic History   Marital status: Legally  Separated    Spouse name: Not on file   Number of children: Not on file   Years of education: Not on file   Highest education level: Not on file  Occupational History   Not on file  Tobacco Use   Smoking status: Every Day    Packs/day: 0.50    Years: 15.00    Pack years: 7.50    Types: Cigarettes   Smokeless tobacco: Never  Vaping Use   Vaping Use: Never used  Substance and Sexual Activity   Alcohol use: No   Drug use: Not Currently   Sexual activity: Not Currently  Other Topics Concern   Not on file  Social History Narrative   ** Merged History Encounter **       Social Determinants of Health   Financial Resource Strain: Not on file  Food Insecurity: Not on file  Transportation Needs: Not on file  Physical Activity: Not on file  Stress: Not on file  Social Connections: Not on file  Intimate Partner Violence: Not on file    Outpatient Encounter Medications as of 03/21/2021  Medication Sig   amLODipine (NORVASC) 5 MG tablet Take 1 tablet (5 mg total) by mouth at bedtime.   cloNIDine (CATAPRES) 0.1 MG tablet Take 1 tablet (0.1 mg total) by mouth 3 (three) times daily as needed. Three times daily as needed for BP over 150/90   FLUoxetine (PROZAC) 20 MG capsule Take 1 capsule (20 mg total) by mouth daily.   hydrochlorothiazide (HYDRODIURIL) 25 MG tablet Take 25 mg by mouth daily.   lisinopril (ZESTRIL) 40 MG tablet  Take 1 tablet (40 mg total) by mouth daily.   SUMAtriptan (IMITREX) 100 MG tablet One tablet at onset of headache. May repeat x 1 in 2 hours if no relief. Maximum of 200 mg in 24 hours.   terbinafine (LAMISIL) 250 MG tablet Take 1 tablet (250 mg total) by mouth daily.   [DISCONTINUED] ibuprofen (ADVIL,MOTRIN) 200 MG tablet Take 200 mg by mouth every 6 (six) hours as needed. For headache    [DISCONTINUED] lisinopril (ZESTRIL) 10 MG tablet Take 1 tablet (10 mg total) by mouth daily.   No facility-administered encounter medications on file as of 03/21/2021.    No  Known Allergies  Review of Systems  Constitutional:  Positive for activity change, appetite change and fatigue. Negative for chills, diaphoresis, fever, malaise/fatigue and unexpected weight change.  HENT: Negative.    Eyes: Negative.  Negative for blurred vision.  Respiratory:  Negative for cough, chest tightness and shortness of breath.   Cardiovascular:  Negative for chest pain, palpitations, orthopnea, leg swelling and PND.  Gastrointestinal:  Negative for abdominal pain, blood in stool, constipation, diarrhea, nausea and vomiting.  Endocrine: Negative.   Genitourinary:  Negative for decreased urine volume, difficulty urinating, dysuria, frequency and urgency.  Musculoskeletal:  Negative for arthralgias, myalgias and neck pain.  Skin: Negative.   Allergic/Immunologic: Negative.   Neurological:  Positive for headaches. Negative for dizziness, tremors, seizures, syncope, facial asymmetry, speech difficulty, weakness, light-headedness and numbness.  Hematological: Negative.   Psychiatric/Behavioral:  Positive for agitation, decreased concentration, dysphoric mood and sleep disturbance. Negative for behavioral problems, confusion, hallucinations, self-injury and suicidal ideas. The patient is nervous/anxious. The patient is not hyperactive.   All other systems reviewed and are negative.      Objective:  BP (!) 167/119   Pulse 81   Temp 97.6 F (36.4 C)   Ht 5' 4" (1.626 m)   Wt 170 lb (77.1 kg)   LMP 02/18/2021 (Approximate)   SpO2 100%   BMI 29.18 kg/m    Wt Readings from Last 3 Encounters:  03/21/21 170 lb (77.1 kg)  03/07/21 166 lb (75.3 kg)  03/17/19 164 lb (74.4 kg)    Physical Exam Vitals and nursing note reviewed.  Constitutional:      General: She is not in acute distress.    Appearance: Normal appearance. She is well-developed, well-groomed and overweight. She is not ill-appearing, toxic-appearing or diaphoretic.  HENT:     Head: Normocephalic and atraumatic.      Jaw: There is normal jaw occlusion.     Right Ear: Hearing normal.     Left Ear: Hearing normal.     Nose: Nose normal.     Mouth/Throat:     Lips: Pink.     Mouth: Mucous membranes are moist.     Pharynx: Oropharynx is clear. Uvula midline.  Eyes:     General: Lids are normal.     Extraocular Movements: Extraocular movements intact.     Conjunctiva/sclera: Conjunctivae normal.     Pupils: Pupils are equal, round, and reactive to light.  Neck:     Thyroid: No thyroid mass, thyromegaly or thyroid tenderness.     Vascular: No carotid bruit or JVD.     Trachea: Trachea and phonation normal.  Cardiovascular:     Rate and Rhythm: Normal rate and regular rhythm.     Chest Wall: PMI is not displaced.     Pulses: Normal pulses.     Heart sounds: Normal heart sounds. No murmur heard.  No friction rub. No gallop.  Pulmonary:     Effort: Pulmonary effort is normal. No respiratory distress.     Breath sounds: Normal breath sounds. No wheezing.  Abdominal:     General: Bowel sounds are normal. There is no distension or abdominal bruit.     Palpations: Abdomen is soft. There is no hepatomegaly or splenomegaly.     Tenderness: There is no abdominal tenderness. There is no right CVA tenderness or left CVA tenderness.     Hernia: No hernia is present.  Musculoskeletal:        General: Normal range of motion.     Cervical back: Normal range of motion and neck supple.     Right lower leg: No edema.     Left lower leg: No edema.  Feet:     Right foot:     Skin integrity: Skin integrity normal.     Toenail Condition: Right toenails are abnormally thick. Fungal disease present.    Comments: Rigth great toenail abnormally thick and has yellow discoloration Lymphadenopathy:     Cervical: No cervical adenopathy.  Skin:    General: Skin is warm and dry.     Capillary Refill: Capillary refill takes less than 2 seconds.     Coloration: Skin is not cyanotic, jaundiced or pale.     Findings: No  rash.  Neurological:     General: No focal deficit present.     Mental Status: She is alert and oriented to person, place, and time.     Sensory: Sensation is intact.     Motor: Motor function is intact.     Coordination: Coordination is intact.     Gait: Gait is intact.     Deep Tendon Reflexes: Reflexes are normal and symmetric.  Psychiatric:        Attention and Perception: Attention and perception normal.        Mood and Affect: Mood and affect normal.        Speech: Speech normal.        Behavior: Behavior normal. Behavior is cooperative.        Thought Content: Thought content normal.        Cognition and Memory: Cognition and memory normal.        Judgment: Judgment normal.    Results for orders placed or performed in visit on 03/07/21  CMP14+EGFR  Result Value Ref Range   Glucose 91 70 - 99 mg/dL   BUN 8 6 - 20 mg/dL   Creatinine, Ser 0.86 0.57 - 1.00 mg/dL   eGFR 89 >59 mL/min/1.73   BUN/Creatinine Ratio 9 9 - 23   Sodium 138 134 - 144 mmol/L   Potassium 4.1 3.5 - 5.2 mmol/L   Chloride 101 96 - 106 mmol/L   CO2 22 20 - 29 mmol/L   Calcium 9.5 8.7 - 10.2 mg/dL   Total Protein 7.2 6.0 - 8.5 g/dL   Albumin 4.6 3.8 - 4.8 g/dL   Globulin, Total 2.6 1.5 - 4.5 g/dL   Albumin/Globulin Ratio 1.8 1.2 - 2.2   Bilirubin Total 0.4 0.0 - 1.2 mg/dL   Alkaline Phosphatase 89 44 - 121 IU/L   AST 19 0 - 40 IU/L   ALT 15 0 - 32 IU/L  CBC with Differential/Platelet  Result Value Ref Range   WBC 6.6 3.4 - 10.8 x10E3/uL   RBC 5.18 3.77 - 5.28 x10E6/uL   Hemoglobin 13.7 11.1 - 15.9 g/dL   Hematocrit 43.4 34.0 -  46.6 %   MCV 84 79 - 97 fL   MCH 26.4 (L) 26.6 - 33.0 pg   MCHC 31.6 31.5 - 35.7 g/dL   RDW 16.2 (H) 11.7 - 15.4 %   Platelets 345 150 - 450 x10E3/uL   Neutrophils 58 Not Estab. %   Lymphs 32 Not Estab. %   Monocytes 7 Not Estab. %   Eos 2 Not Estab. %   Basos 1 Not Estab. %   Neutrophils Absolute 3.9 1.4 - 7.0 x10E3/uL   Lymphocytes Absolute 2.2 0.7 - 3.1 x10E3/uL    Monocytes Absolute 0.5 0.1 - 0.9 x10E3/uL   EOS (ABSOLUTE) 0.1 0.0 - 0.4 x10E3/uL   Basophils Absolute 0.0 0.0 - 0.2 x10E3/uL   Immature Granulocytes 0 Not Estab. %   Immature Grans (Abs) 0.0 0.0 - 0.1 x10E3/uL  Thyroid Panel With TSH  Result Value Ref Range   TSH 1.670 0.450 - 4.500 uIU/mL   T4, Total 11.5 4.5 - 12.0 ug/dL   T3 Uptake Ratio 26 24 - 39 %   Free Thyroxine Index 3.0 1.2 - 4.9  Lipid panel  Result Value Ref Range   Cholesterol, Total 208 (H) 100 - 199 mg/dL   Triglycerides 124 0 - 149 mg/dL   HDL 48 >39 mg/dL   VLDL Cholesterol Cal 22 5 - 40 mg/dL   LDL Chol Calc (NIH) 138 (H) 0 - 99 mg/dL   Chol/HDL Ratio 4.3 0.0 - 4.4 ratio  Microalbumin / creatinine urine ratio  Result Value Ref Range   Creatinine, Urine 443.6 Not Estab. mg/dL   Microalbumin, Urine 75.6 Not Estab. ug/mL   Microalb/Creat Ratio 17 0 - 29 mg/g creat       Pertinent labs & imaging results that were available during my care of the patient were reviewed by me and considered in my medical decision making.  Assessment & Plan:  Nou was seen today for hypertension.  Diagnoses and all orders for this visit:  Uncontrolled hypertension Still uncontrolled on lisinopril and HCTZ, will add norvasc to regimen. DASH diet and exercise along with smoking cessation discussed in detail. Recheck BMP today. Follow up in 4 weeks for reevaluation.  -     amLODipine (NORVASC) 5 MG tablet; Take 1 tablet (5 mg total) by mouth at bedtime. -     BMP8+EGFR  Onychomycosis Right great toe. Will treat with oral lamisil as topicals have failed. Repeat CMP in 4 weeks.  -     terbinafine (LAMISIL) 250 MG tablet; Take 1 tablet (250 mg total) by mouth daily.     Continue all other maintenance medications.  Follow up plan: Return in about 4 weeks (around 04/18/2021), or if symptoms worsen or fail to improve, for HTN.   Continue healthy lifestyle choices, including diet (rich in fruits, vegetables, and lean proteins,  and low in salt and simple carbohydrates) and exercise (at least 30 minutes of moderate physical activity daily).  Educational handout given for DASH diet   The above assessment and management plan was discussed with the patient. The patient verbalized understanding of and has agreed to the management plan. Patient is aware to call the clinic if they develop any new symptoms or if symptoms persist or worsen. Patient is aware when to return to the clinic for a follow-up visit. Patient educated on when it is appropriate to go to the emergency department.   Monia Pouch, FNP-C [facility] [phone]

## 2021-03-22 LAB — BMP8+EGFR
BUN/Creatinine Ratio: 12 (ref 9–23)
BUN: 10 mg/dL (ref 6–20)
CO2: 22 mmol/L (ref 20–29)
Calcium: 9.4 mg/dL (ref 8.7–10.2)
Chloride: 103 mmol/L (ref 96–106)
Creatinine, Ser: 0.83 mg/dL (ref 0.57–1.00)
Glucose: 98 mg/dL (ref 70–99)
Potassium: 4.4 mmol/L (ref 3.5–5.2)
Sodium: 138 mmol/L (ref 134–144)
eGFR: 92 mL/min/{1.73_m2} (ref >59)

## 2021-03-28 ENCOUNTER — Encounter: Payer: Self-pay | Admitting: *Deleted

## 2021-03-28 ENCOUNTER — Telehealth: Payer: Self-pay | Admitting: *Deleted

## 2021-03-28 ENCOUNTER — Telehealth: Payer: Self-pay | Admitting: Family Medicine

## 2021-03-28 DIAGNOSIS — I10 Essential (primary) hypertension: Secondary | ICD-10-CM

## 2021-03-28 NOTE — Telephone Encounter (Signed)
Spoke with patient. She says she is taking lisinopril in the am and amlodipine in the pm. She uses clonidine for rescue during the day if over 150/90.   She states her blood pressure is still not under control. She says she is dizzy during the day and she cannot work feeling this way.  She says she had to go home today. She is worried that she will not be able to work.

## 2021-03-28 NOTE — Telephone Encounter (Signed)
Work excuse letter written.

## 2021-03-28 NOTE — Telephone Encounter (Signed)
Spouse is calling about this message. He is aware Rakes has to address the message before nurse will call back. please call him back at [phone].

## 2021-03-28 NOTE — Telephone Encounter (Signed)
Spoke to patients husband. Informed cardiology referral. Informed work note will be written excusing patient from work for the next two weeks. Explained that a follow up needs to be scheduled for two weeks unless cardiology schedules interim.

## 2021-03-28 NOTE — Addendum Note (Signed)
Addended by: Sonny Masters on: 03/28/2021 01:15 PM   Modules accepted: Orders

## 2021-04-18 ENCOUNTER — Encounter: Payer: Self-pay | Admitting: Family Medicine

## 2021-04-18 ENCOUNTER — Ambulatory Visit (INDEPENDENT_AMBULATORY_CARE_PROVIDER_SITE_OTHER): Payer: Managed Care, Other (non HMO) | Admitting: Family Medicine

## 2021-04-18 VITALS — BP 163/113 | HR 92 | Ht 64.0 in | Wt 166.0 lb

## 2021-04-18 DIAGNOSIS — I10 Essential (primary) hypertension: Secondary | ICD-10-CM | POA: Diagnosis not present

## 2021-04-18 DIAGNOSIS — B351 Tinea unguium: Secondary | ICD-10-CM | POA: Diagnosis not present

## 2021-04-18 IMAGING — DX DG CHEST 1V PORT
1 series · 1 of 1 positions shown · non-contrast
Comparison: None.

CLINICAL DATA: Chest pain for 4 days.

EXAM:
PORTABLE CHEST 1 VIEW

[chest ap]
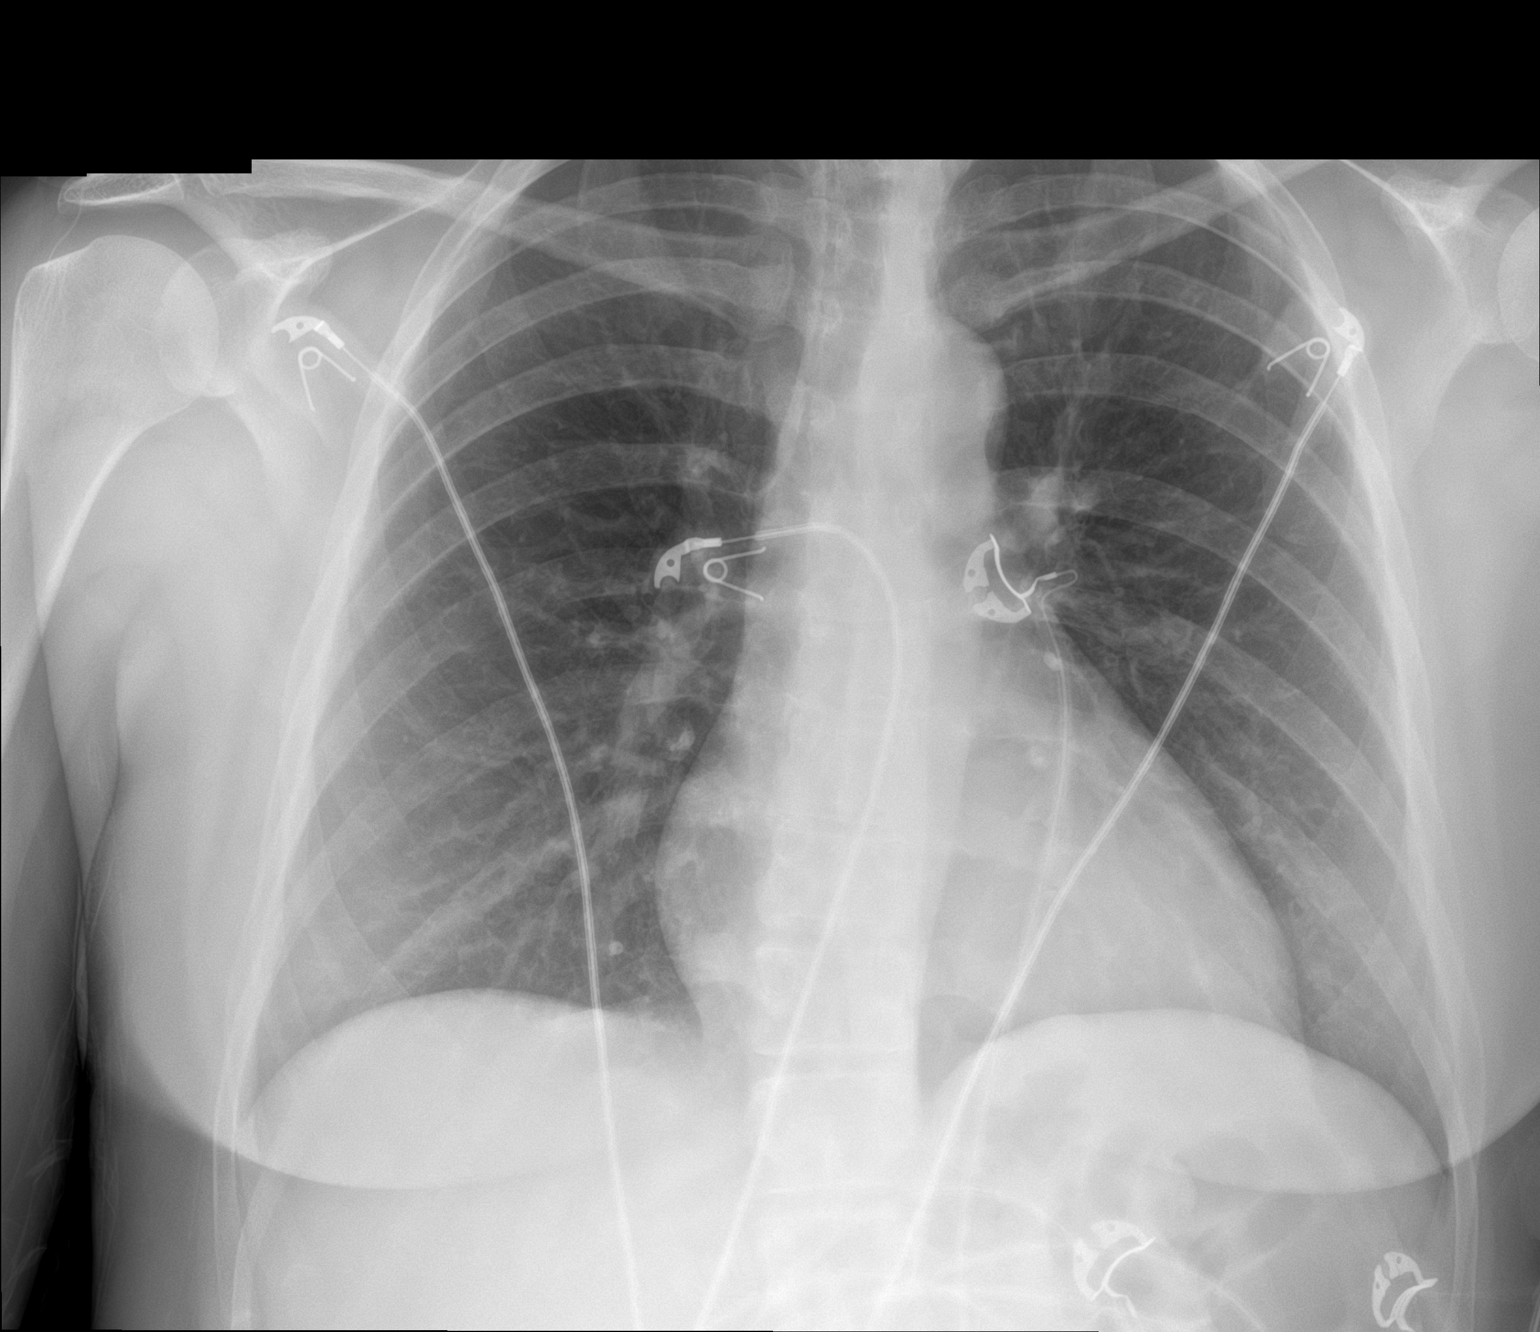

[1 of 1 positions shown; findings below may reference images not displayed]

FINDINGS: The heart size and mediastinal contours are within normal limits.
Both lungs are clear. The visualized skeletal structures are
unremarkable.
IMPRESSION: No active disease.

## 2021-04-18 MED ORDER — LISINOPRIL 40 MG PO TABS: 40.0000 mg | ORAL_TABLET | Freq: Every day | ORAL | 3 refills | Status: AC

## 2021-04-18 MED ORDER — AMLODIPINE BESYLATE 10 MG PO TABS: 10.0000 mg | ORAL_TABLET | Freq: Every day | ORAL | 1 refills | Status: AC

## 2021-04-18 MED ORDER — TERBINAFINE HCL 250 MG PO TABS: 250.0000 mg | ORAL_TABLET | Freq: Every day | ORAL | 0 refills | Status: DC

## 2021-04-18 MED ORDER — HYDROCHLOROTHIAZIDE 25 MG PO TABS: 25.0000 mg | ORAL_TABLET | Freq: Every day | ORAL | 1 refills | Status: AC

## 2021-04-18 NOTE — Progress Notes (Signed)
Subjective:  Patient ID: Marissa Serrano, female    DOB: 12/05/1980, 40 y.o.   MRN: [id number]  Patient Care Team: Baruch Gouty, FNP as PCP - General (Family Medicine) Patient, No Pcp Per (Inactive) (General Practice)   Chief Complaint:  Hypertension   HPI: Marissa Serrano is a 40 y.o. female presenting on 04/18/2021 for Hypertension   Patient presents today for follow-up on uncontrolled hypertension.  She has been taking medications as prescribed.  Blood pressure has slightly improved.  She was referred to cardiology but has not had follow-up. During last visit she was treated for toenail fungus.  Reports pharmacy only provided 1 month worth of therapy, symptoms still present.  Hypertension This is a recurrent problem. The current episode started more than 1 month ago. The problem has been gradually improving since onset. The problem is uncontrolled. Associated symptoms include anxiety, chest pain, headaches, malaise/fatigue and palpitations. Pertinent negatives include no blurred vision, neck pain, orthopnea, peripheral edema, PND, shortness of breath or sweats. Risk factors for coronary artery disease include dyslipidemia. Past treatments include ACE inhibitors, calcium channel blockers, diuretics and lifestyle changes. The current treatment provides mild improvement.    Relevant past medical, surgical, family, and social history reviewed and updated as indicated.  Allergies and medications reviewed and updated. Data reviewed: Chart in Epic.   Past Medical History:  Diagnosis Date   Allergy    Anemia 1991   Anxiety    Chronic headaches    Hypertension     Past Surgical History:  Procedure Laterality Date   FRACTURE SURGERY      Social History   Socioeconomic History   Marital status: Legally Separated    Spouse name: Not on file   Number of children: Not on file   Years of education: Not on file   Highest education level: Not on file  Occupational History    Not on file  Tobacco Use   Smoking status: Every Day    Packs/day: 0.50    Years: 15.00    Pack years: 7.50    Types: Cigarettes   Smokeless tobacco: Never  Vaping Use   Vaping Use: Never used  Substance and Sexual Activity   Alcohol use: No   Drug use: Not Currently   Sexual activity: Not Currently  Other Topics Concern   Not on file  Social History Narrative   ** Merged History Encounter **       Social Determinants of Health   Financial Resource Strain: Not on file  Food Insecurity: Not on file  Transportation Needs: Not on file  Physical Activity: Not on file  Stress: Not on file  Social Connections: Not on file  Intimate Partner Violence: Not on file    Outpatient Encounter Medications as of 04/18/2021  Medication Sig   amLODipine (NORVASC) 10 MG tablet Take 1 tablet (10 mg total) by mouth at bedtime.   cloNIDine (CATAPRES) 0.1 MG tablet Take 1 tablet (0.1 mg total) by mouth 3 (three) times daily as needed. Three times daily as needed for BP over 150/90   FLUoxetine (PROZAC) 20 MG capsule Take 1 capsule (20 mg total) by mouth daily.   hydrochlorothiazide (HYDRODIURIL) 25 MG tablet Take 1 tablet (25 mg total) by mouth daily.   lisinopril (ZESTRIL) 40 MG tablet Take 1 tablet (40 mg total) by mouth daily.   SUMAtriptan (IMITREX) 100 MG tablet One tablet at onset of headache. May repeat x 1 in 2 hours if  no relief. Maximum of 200 mg in 24 hours.   terbinafine (LAMISIL) 250 MG tablet Take 1 tablet (250 mg total) by mouth daily.   [DISCONTINUED] amLODipine (NORVASC) 5 MG tablet Take 1 tablet (5 mg total) by mouth at bedtime.   [DISCONTINUED] hydrochlorothiazide (HYDRODIURIL) 25 MG tablet Take 25 mg by mouth daily.   [DISCONTINUED] lisinopril (ZESTRIL) 40 MG tablet Take 1 tablet (40 mg total) by mouth daily.   [DISCONTINUED] terbinafine (LAMISIL) 250 MG tablet Take 1 tablet (250 mg total) by mouth daily.   No facility-administered encounter medications on file as of  04/18/2021.    No Known Allergies  Review of Systems  Constitutional:  Positive for activity change, fatigue and malaise/fatigue. Negative for appetite change, chills, diaphoresis, fever and unexpected weight change.  HENT: Negative.    Eyes: Negative.  Negative for blurred vision, photophobia and visual disturbance.  Respiratory:  Negative for cough, chest tightness and shortness of breath.   Cardiovascular:  Positive for chest pain and palpitations. Negative for orthopnea, leg swelling and PND.  Gastrointestinal:  Negative for abdominal pain, blood in stool, constipation, diarrhea, nausea and vomiting.  Endocrine: Negative.   Genitourinary:  Negative for decreased urine volume, difficulty urinating, dysuria, frequency and urgency.  Musculoskeletal:  Negative for arthralgias, myalgias and neck pain.  Allergic/Immunologic: Negative.   Neurological:  Positive for headaches. Negative for dizziness, tremors, seizures, syncope, facial asymmetry, speech difficulty, weakness, light-headedness and numbness.  Hematological: Negative.   Psychiatric/Behavioral:  Negative for confusion, hallucinations, sleep disturbance and suicidal ideas.   All other systems reviewed and are negative.      Objective:  BP (!) 163/113   Pulse 92   Ht _0  (1.626 m)   Wt 166 lb (75.3 kg)   LMP 03/15/2021 (Approximate)   SpO2 100%   BMI 28.49 kg/m    Wt Readings from Last 3 Encounters:  04/18/21 166 lb (75.3 kg)  03/21/21 170 lb (77.1 kg)  03/07/21 166 lb (75.3 kg)    Physical Exam Vitals and nursing note reviewed.  Constitutional:      General: She is not in acute distress.    Appearance: Normal appearance. She is overweight. She is not ill-appearing, toxic-appearing or diaphoretic.  HENT:     Head: Normocephalic and atraumatic.     Nose: Nose normal.     Mouth/Throat:     Mouth: Mucous membranes are moist.     Pharynx: Oropharynx is clear.  Eyes:     Conjunctiva/sclera: Conjunctivae normal.      Pupils: Pupils are equal, round, and reactive to light.  Cardiovascular:     Rate and Rhythm: Normal rate and regular rhythm.     Heart sounds: Normal heart sounds. No murmur heard.   No friction rub. No gallop.  Pulmonary:     Effort: Pulmonary effort is normal.     Breath sounds: Normal breath sounds.  Abdominal:     General: Abdomen is flat. There is no distension.     Palpations: Abdomen is soft.     Tenderness: There is no abdominal tenderness.  Feet:     Right foot:     Toenail Condition: Right toenails are abnormally thick. Fungal disease present.    Left foot:     Toenail Condition: Left toenails are abnormally thick. Fungal disease present. Skin:    General: Skin is warm and dry.     Capillary Refill: Capillary refill takes less than 2 seconds.  Neurological:     General: No  focal deficit present.     Mental Status: She is alert and oriented to person, place, and time.  Psychiatric:        Mood and Affect: Mood normal.        Behavior: Behavior normal.        Thought Content: Thought content normal.        Judgment: Judgment normal.    Results for orders placed or performed in visit on 03/21/21  BMP8+EGFR  Result Value Ref Range   Glucose 98 70 - 99 mg/dL   BUN 10 6 - 20 mg/dL   Creatinine, Ser 0.83 0.57 - 1.00 mg/dL   eGFR 92 >59 mL/min/1.73   BUN/Creatinine Ratio 12 9 - 23   Sodium 138 134 - 144 mmol/L   Potassium 4.4 3.5 - 5.2 mmol/L   Chloride 103 96 - 106 mmol/L   CO2 22 20 - 29 mmol/L   Calcium 9.4 8.7 - 10.2 mg/dL       Pertinent labs & imaging results that were available during my care of the patient were reviewed by me and considered in my medical decision making.  Assessment & Plan:  Jayln was seen today for hypertension.  Diagnoses and all orders for this visit:  Uncontrolled hypertension Blood pressure still remains uncontrolled on current regimen.  Will increase amlodipine to 10 mg nightly.  Repeat CMP today.  Has not followed up with  cardiology, will place new referral today.  Will obtain renal artery ultrasound for potential secondary causes. -     amLODipine (NORVASC) 10 MG tablet; Take 1 tablet (10 mg total) by mouth at bedtime. -     lisinopril (ZESTRIL) 40 MG tablet; Take 1 tablet (40 mg total) by mouth daily. -     hydrochlorothiazide (HYDRODIURIL) 25 MG tablet; Take 1 tablet (25 mg total) by mouth daily. -     CMP14+EGFR -     Ambulatory referral to Cardiology -     US Renal Artery Stenosis; Future  Onychomycosis [facility] only provided 1 month worth of therapy, will send remainder of therapy in today.  Recheck CMP today. -     terbinafine (LAMISIL) 250 MG tablet; Take 1 tablet (250 mg total) by mouth daily. -     CMP14+EGFR     Continue all other maintenance medications.  Follow up plan: Return in about 6 weeks (around 05/30/2021), or if symptoms worsen or fail to improve, for HTN.   Continue healthy lifestyle choices, including diet (rich in fruits, vegetables, and lean proteins, and low in salt and simple carbohydrates) and exercise (at least 30 minutes of moderate physical activity daily).  Educational handout given for DASH diet, HTN  The above assessment and management plan was discussed with the patient. The patient verbalized understanding of and has agreed to the management plan. Patient is aware to call the clinic if they develop any new symptoms or if symptoms persist or worsen. Patient is aware when to return to the clinic for a follow-up visit. Patient educated on when it is appropriate to go to the emergency department.   Monia Pouch, FNP-C [facility] [phone]

## 2021-04-18 NOTE — Patient Instructions (Signed)

## 2021-04-19 LAB — CMP14+EGFR
ALT: 10 IU/L (ref 0–32)
AST: 16 IU/L (ref 0–40)
Albumin/Globulin Ratio: 1.8 (ref 1.2–2.2)
Albumin: 4.2 g/dL (ref 3.8–4.8)
Alkaline Phosphatase: 86 IU/L (ref 44–121)
BUN/Creatinine Ratio: 10 (ref 9–23)
BUN: 7 mg/dL (ref 6–20)
Bilirubin Total: 0.3 mg/dL (ref 0.0–1.2)
CO2: 24 mmol/L (ref 20–29)
Calcium: 9.2 mg/dL (ref 8.7–10.2)
Chloride: 105 mmol/L (ref 96–106)
Creatinine, Ser: 0.73 mg/dL (ref 0.57–1.00)
Globulin, Total: 2.4 g/dL (ref 1.5–4.5)
Glucose: 86 mg/dL (ref 70–99)
Potassium: 4.8 mmol/L (ref 3.5–5.2)
Sodium: 140 mmol/L (ref 134–144)
Total Protein: 6.6 g/dL (ref 6.0–8.5)
eGFR: 108 mL/min/{1.73_m2} (ref >59)

## 2021-05-15 NOTE — Progress Notes (Signed)
Cardiology Office Note:    Date:  05/19/2021   ID:  Marissa BackerJennifer R. Melby, DOB [birth year], MRN [id number]  PCP:  Sonny Mastersakes, Linda M, FNP  Cardiologist:  None  Electrophysiologist:  None   Referring MD: Sonny Mastersakes, Linda M, FNP   Chief Complaint  Patient presents with   Hypertension   History of Present Illness:    Marissa Serrano is a 41 y.o. female with a hx of hypertension, tobacco use who is referred by Gilford SilviusLinda Rakes, NP for evaluation of hypertension.  She reports she was diagnosed with hypertension at age 41.  BP has been uncontrolled.  Often in 170s to 180s when she checks at home.  Reports compliance with all her meds.  She denies any chest pain.  Reports intermittent shortness of breath.  Reports intermittent lightheadedness with standing.  Reports has been having palpitations where feels like heart is fluttering, occurs 2-3 times per week, will last for few minutes.  She smokes 0.5 packs/day.  Family history includes maternal grandmother had CVA, paternal grandmother had CVA, paternal grandfather had multiple MIs and CVAs.  Past Medical History:  Diagnosis Date   Allergy    Anemia 1991   Anxiety    Chronic headaches    Hypertension     Past Surgical History:  Procedure Laterality Date   FRACTURE SURGERY      Current Medications: Current Meds  Medication Sig   amLODipine (NORVASC) 10 MG tablet Take 1 tablet (10 mg total) by mouth at bedtime.   cloNIDine (CATAPRES) 0.1 MG tablet Take 1 tablet (0.1 mg total) by mouth 3 (three) times daily as needed. Three times daily as needed for BP over 150/90   FLUoxetine (PROZAC) 20 MG capsule Take 1 capsule (20 mg total) by mouth daily.   hydrochlorothiazide (HYDRODIURIL) 25 MG tablet Take 1 tablet (25 mg total) by mouth daily.   lisinopril (ZESTRIL) 40 MG tablet Take 1 tablet (40 mg total) by mouth daily.   spironolactone (ALDACTONE) 25 MG tablet Take 1 tablet (25 mg total) by mouth daily.   SUMAtriptan (IMITREX) 100 MG tablet One tablet at  onset of headache. May repeat x 1 in 2 hours if no relief. Maximum of 200 mg in 24 hours.     Allergies:   Patient has no known allergies.   Social History   Socioeconomic History   Marital status: Legally Separated    Spouse name: Not on file   Number of children: Not on file   Years of education: Not on file   Highest education level: Not on file  Occupational History   Not on file  Tobacco Use   Smoking status: Every Day    Packs/day: 0.50    Years: 15.00    Pack years: 7.50    Types: Cigarettes   Smokeless tobacco: Never  Vaping Use   Vaping Use: Never used  Substance and Sexual Activity   Alcohol use: No   Drug use: Not Currently   Sexual activity: Not Currently  Other Topics Concern   Not on file  Social History Narrative   ** Merged History Encounter **       Social Determinants of Health   Financial Resource Strain: Not on file  Food Insecurity: Not on file  Transportation Needs: Not on file  Physical Activity: Not on file  Stress: Not on file  Social Connections: Not on file     Family History: The patient's family history includes Asthma in her father; COPD in her  father; Cancer in her maternal grandmother and paternal grandmother; Diabetes in her maternal grandfather and paternal grandfather; Heart disease in her maternal grandfather and paternal grandfather; Hypertension in her father, maternal grandfather, maternal grandmother, paternal grandfather, and paternal grandmother; Stroke in her maternal grandfather, maternal grandmother, paternal grandfather, and paternal grandmother.  ROS:   Please see the history of present illness.     All other systems reviewed and are negative.  EKGs/Labs/Other Studies Reviewed:    The following studies were reviewed today:   EKG:  EKG is not ordered today.  The ekg ordered 03/07/2021 shows normal sinus rhythm, rate 65, nonspecific T wave flattening  Recent Labs: 03/07/2021: Hemoglobin 13.7; Platelets 345; TSH  1.670 04/18/2021: ALT 10 05/19/2021: BUN 11; Creatinine, Ser 0.74; Potassium 4.0; Sodium 135  Recent Lipid Panel    Component Value Date/Time   CHOL 208 (H) 03/07/2021 1531   TRIG 124 03/07/2021 1531   HDL 48 03/07/2021 1531   CHOLHDL 4.3 03/07/2021 1531   LDLCALC 138 (H) 03/07/2021 1531    Physical Exam:    VS:  BP (!) 160/120 (BP Location: Left Arm)    Pulse 84    Ht 5\' 4"  (1.626 m)    Wt 170 lb (77.1 kg)    SpO2 100%    BMI 29.18 kg/m     Wt Readings from Last 3 Encounters:  05/19/21 170 lb (77.1 kg)  04/18/21 166 lb (75.3 kg)  03/21/21 170 lb (77.1 kg)     GEN:  Well nourished, well developed in no acute distress HEENT: Normal NECK: No JVD; No carotid bruits LYMPHATICS: No lymphadenopathy CARDIAC: RRR, no murmurs, rubs, gallops RESPIRATORY:  Clear to auscultation without rales, wheezing or rhonchi  ABDOMEN: Soft, non-tender, non-distended MUSCULOSKELETAL:  No edema; No deformity  SKIN: Warm and dry NEUROLOGIC:  Alert and oriented x 3 PSYCHIATRIC:  Normal affect   ASSESSMENT:    1. Resistant hypertension   2. Palpitations   3. Tobacco use    PLAN:     Hypertension: On amlodipine 10 mg daily, lisinopril 40 mg daily, HCTZ 25 mg daily.  She was also prescribed as needed clonidine, but does not take as makes her feel tired.  Given resistant hypertension, as well as early age of onset, warrants work-up for secondary causes.  CMET, TSH unremarkable - Renal duplex  - Renin/aldosterone - Serum metanephrines - TTE - Continue amlodipine, lisinopril, HCTZ.  Will add spironolactone 25 mg daily.  Check BMET in 1 week - Refer to hypertension clinic  Palpitations: Description concerning for arrhythmia.  Evaluate with Zio patch x7 days  Tobacco use: Counseled on the risk of tobacco use and cessation strongly encouraged  RTC in 6 weeks   Medication Adjustments/Labs and Tests Ordered: Current medicines are reviewed at length with the patient today.  Concerns regarding  medicines are outlined above.  Orders Placed This Encounter  Procedures   13/08/22 RENAL ARTERY DUPLEX COMPLETE   Aldosterone + renin activity w/ ratio   Metanephrines, plasma   Basic metabolic panel   Ambulatory referral to [facility] [address]   ECHOCARDIOGRAM COMPLETE   Meds ordered this encounter  Medications   spironolactone (ALDACTONE) 25 MG tablet    Sig: Take 1 tablet (25 mg total) by mouth daily.    Dispense:  90 tablet    Refill:  3    Patient Instructions  Medication Instructions:  Your physician has recommended you make the following change in your medication:  START  Spironolactone 25 mg tabelts daily  *If you need a refill on your cardiac medications before your next appointment, please call your pharmacy*   Lab Work: BMET (now and again in 1 week)  METAPNEPH ALDOST. If you have labs (blood work) drawn today and your tests are completely normal, you will receive your results only by: MyChart Message (if you have MyChart) OR A paper copy in the mail If you have any lab test that is abnormal or we need to change your treatment, we will call you to review the results.   Testing/Procedures: Renal Ultrasound Your physician has requested that you have an echocardiogram. Echocardiography is a painless test that uses sound waves to create images of your heart. It provides your doctor with information about the size and shape of your heart and how well your hearts chambers and valves are working. This procedure takes approximately one hour. There are no restrictions for this procedure.    Follow-Up: At [facility], you and your health needs are our priority.  As part of our continuing mission to provide you with exceptional heart care, we have created designated Provider Care Teams.  These Care Teams include your primary Cardiologist (physician) and Advanced Practice Providers (APPs -  Physician Assistants and Nurse Practitioners) who all  work together to provide you with the care you need, when you need it.  We recommend signing up for the patient portal called "MyChart".  Sign up information is provided on this After Visit Summary.  MyChart is used to connect with patients for Virtual Visits (Telemedicine).  Patients are able to view lab/test results, encounter notes, upcoming appointments, etc.  Non-urgent messages can be sent to your provider as well.   To learn more about what you can do with MyChart, go to [email or web address].    Your next appointment:   6 week(s)  The format for your next appointment:   In Person  Provider:   Epifanio Lesches, MD     Other Instructions Please keep a check on your blood pressure for 2 weeks and call our office with updates.     Signed, Little Ishikawa, MD  05/19/2021 10:07 PM    [facility]

## 2021-05-19 ENCOUNTER — Ambulatory Visit (INDEPENDENT_AMBULATORY_CARE_PROVIDER_SITE_OTHER): Payer: Managed Care, Other (non HMO) | Admitting: Cardiology

## 2021-05-19 ENCOUNTER — Ambulatory Visit (INDEPENDENT_AMBULATORY_CARE_PROVIDER_SITE_OTHER): Payer: Managed Care, Other (non HMO)

## 2021-05-19 ENCOUNTER — Other Ambulatory Visit (HOSPITAL_COMMUNITY)
Admission: RE | Admit: 2021-05-19 | Discharge: 2021-05-19 | Disposition: A | Discharge status: Home / Self Care | Payer: Medicaid Other | Source: Ambulatory Visit | Attending: Cardiology | Admitting: Cardiology

## 2021-05-19 ENCOUNTER — Encounter: Payer: Self-pay | Admitting: Cardiology

## 2021-05-19 ENCOUNTER — Other Ambulatory Visit: Payer: Self-pay | Admitting: Cardiology

## 2021-05-19 ENCOUNTER — Other Ambulatory Visit: Payer: Self-pay

## 2021-05-19 VITALS — BP 160/120 | HR 84 | Ht 64.0 in | Wt 170.0 lb

## 2021-05-19 DIAGNOSIS — R002 Palpitations: Secondary | ICD-10-CM

## 2021-05-19 DIAGNOSIS — R000 Tachycardia, unspecified: Secondary | ICD-10-CM | POA: Diagnosis present

## 2021-05-19 DIAGNOSIS — I10 Essential (primary) hypertension: Secondary | ICD-10-CM | POA: Diagnosis present

## 2021-05-19 DIAGNOSIS — Z720 Tobacco use: Secondary | ICD-10-CM | POA: Diagnosis not present

## 2021-05-19 LAB — BASIC METABOLIC PANEL
Anion gap: 6 (ref 5–15)
BUN: 11 mg/dL (ref 6–20)
CO2: 24 mmol/L (ref 22–32)
Calcium: 8.9 mg/dL (ref 8.9–10.3)
Chloride: 105 mmol/L (ref 98–111)
Creatinine, Ser: 0.74 mg/dL (ref 0.44–1.00)
GFR, Estimated: >60 mL/min (ref >60)
Glucose, Bld: 101 mg/dL — ABNORMAL HIGH (ref 70–99)
Potassium: 4 mmol/L (ref 3.5–5.1)
Sodium: 135 mmol/L (ref 135–145)

## 2021-05-19 MED ORDER — SPIRONOLACTONE 25 MG PO TABS: 25.0000 mg | ORAL_TABLET | Freq: Every day | ORAL | 3 refills | Status: AC

## 2021-05-19 NOTE — Patient Instructions (Addendum)
Medication Instructions:  Your physician has recommended you make the following change in your medication:  START Spironolactone 25 mg tabelts daily  *If you need a refill on your cardiac medications before your next appointment, please call your pharmacy*   Lab Work: BMET (now and again in 1 week)  METAPNEPH ALDOST. If you have labs (blood work) drawn today and your tests are completely normal, you will receive your results only by: MyChart Message (if you have MyChart) OR A paper copy in the mail If you have any lab test that is abnormal or we need to change your treatment, we will call you to review the results.   Testing/Procedures: Renal Ultrasound Your physician has requested that you have an echocardiogram. Echocardiography is a painless test that uses sound waves to create images of your heart. It provides your doctor with information about the size and shape of your heart and how well your hearts chambers and valves are working. This procedure takes approximately one hour. There are no restrictions for this procedure.    Follow-Up: At [facility], you and your health needs are our priority.  As part of our continuing mission to provide you with exceptional heart care, we have created designated Provider Care Teams.  These Care Teams include your primary Cardiologist (physician) and Advanced Practice Providers (APPs -  Physician Assistants and Nurse Practitioners) who all work together to provide you with the care you need, when you need it.  We recommend signing up for the patient portal called "MyChart".  Sign up information is provided on this After Visit Summary.  MyChart is used to connect with patients for Virtual Visits (Telemedicine).  Patients are able to view lab/test results, encounter notes, upcoming appointments, etc.  Non-urgent messages can be sent to your provider as well.   To learn more about what you can do with MyChart, go to [email or web address].     Your next appointment:   6 week(s)  The format for your next appointment:   In Person  Provider:   Epifanio Lesches, MD     Other Instructions Please keep a check on your blood pressure for 2 weeks and call our office with updates.

## 2021-05-22 ENCOUNTER — Other Ambulatory Visit: Payer: Self-pay | Admitting: *Deleted

## 2021-05-22 DIAGNOSIS — I10 Essential (primary) hypertension: Secondary | ICD-10-CM

## 2021-05-24 LAB — ALDOSTERONE + RENIN ACTIVITY W/ RATIO
ALDO / PRA Ratio: 12.2 (ref 0.0–30.0)
Aldosterone: 6.6 ng/dL (ref 0.0–30.0)
PRA LC/MS/MS: 0.541 ng/mL/hr (ref 0.167–5.380)

## 2021-05-24 LAB — METANEPHRINES, PLASMA
Metanephrine, Free: 40.7 pg/mL (ref 0.0–88.0)
Normetanephrine, Free: 114.7 pg/mL (ref 0.0–210.1)

## 2021-05-25 ENCOUNTER — Encounter: Payer: Self-pay | Admitting: *Deleted

## 2021-06-01 ENCOUNTER — Telehealth (HOSPITAL_BASED_OUTPATIENT_CLINIC_OR_DEPARTMENT_OTHER): Payer: Self-pay | Admitting: Cardiology

## 2021-06-01 NOTE — Telephone Encounter (Signed)
Called patient 05/31/21 and 06/01/21 to discuss scheduling the renal doppler ordered by Dr. Les Pou answer and no voice mail

## 2021-06-02 NOTE — Telephone Encounter (Signed)
Called to schedule---no answer and no voice mail kf

## 2021-06-06 ENCOUNTER — Encounter (HOSPITAL_BASED_OUTPATIENT_CLINIC_OR_DEPARTMENT_OTHER): Payer: Self-pay | Admitting: Cardiology

## 2021-06-06 NOTE — Telephone Encounter (Signed)
Called to discuss scheduling the renal duplex ordered by Dr. Les Pou answer and no voice mail---will mail letter requesting patient call to schedule

## 2021-06-09 ENCOUNTER — Encounter: Payer: Self-pay | Admitting: Cardiology

## 2021-06-19 ENCOUNTER — Encounter: Payer: Self-pay | Admitting: Cardiovascular Disease

## 2021-06-21 ENCOUNTER — Ambulatory Visit (HOSPITAL_COMMUNITY): Admission: RE | Admit: 2021-06-21 | Payer: Managed Care, Other (non HMO) | Source: Ambulatory Visit

## 2021-06-25 ENCOUNTER — Encounter: Payer: Self-pay | Admitting: Cardiology

## 2021-06-26 ENCOUNTER — Encounter: Payer: Self-pay | Admitting: Cardiology

## 2021-07-05 NOTE — Progress Notes (Unsigned)
Cardiology Office Note    Date:  07/05/2021   ID:  Marissa Serrano, DOB 04-25-1981, MRN [id number]  PCP:  Sonny Masters, FNP  Cardiologist: None    No chief complaint on file.   History of Present Illness:    Marissa Serrano is a 41 y.o. female with past medical history of HTN, palpitations and tobacco use who presents to the office today for 6-week follow-up.   She was examined by Dr. Gaynelle Arabian in 05/2021 as a new patient referral and reported having been diagnosed with hypertension at the age of 71 and BP had been uncontrolled when checked at home.  She also reported intermittent dyspnea and palpitations. Given her young age and resistant hypertension, medical work-up was recommended including renal duplex, renin/aldosterone, serum metanephrines and an echocardiogram. She was continued on Amlodipine, Lisinopril and HCTZ with Spironolactone 25 mg daily being added to her medication regimen. It was also recommended that she have a 7-day Zio patch. She has not yet had her echocardiogram or renal artery duplex. Her Zio patch did show predominantly normal sinus rhythm with an average heart rate of 66 bpm. She did have brief episodes of SVT and possible atrial tachycardia with the longest lasting for 19 beats.  - Echo - Renal Artery Duplex  Past Medical History:  Diagnosis Date   Allergy    Anemia 1991   Anxiety    Chronic headaches    Hypertension     Past Surgical History:  Procedure Laterality Date   FRACTURE SURGERY      Current Medications: Outpatient Medications Prior to Visit  Medication Sig Dispense Refill   amLODipine (NORVASC) 10 MG tablet Take 1 tablet (10 mg total) by mouth at bedtime. 90 tablet 1   cloNIDine (CATAPRES) 0.1 MG tablet Take 1 tablet (0.1 mg total) by mouth 3 (three) times daily as needed. Three times daily as needed for BP over 150/90 90 tablet 3   FLUoxetine (PROZAC) 20 MG capsule Take 1 capsule (20 mg total) by mouth daily. 90 capsule 3    hydrochlorothiazide (HYDRODIURIL) 25 MG tablet Take 1 tablet (25 mg total) by mouth daily. 90 tablet 1   lisinopril (ZESTRIL) 40 MG tablet Take 1 tablet (40 mg total) by mouth daily. 90 tablet 3   spironolactone (ALDACTONE) 25 MG tablet Take 1 tablet (25 mg total) by mouth daily. 90 tablet 3   SUMAtriptan (IMITREX) 100 MG tablet One tablet at onset of headache. May repeat x 1 in 2 hours if no relief. Maximum of 200 mg in 24 hours. 5 tablet 0   No facility-administered medications prior to visit.     Allergies:   Patient has no known allergies.   Social History   Socioeconomic History   Marital status: Legally Separated    Spouse name: Not on file   Number of children: Not on file   Years of education: Not on file   Highest education level: Not on file  Occupational History   Not on file  Tobacco Use   Smoking status: Every Day    Packs/day: 0.50    Years: 15.00    Pack years: 7.50    Types: Cigarettes   Smokeless tobacco: Never  Vaping Use   Vaping Use: Never used  Substance and Sexual Activity   Alcohol use: No   Drug use: Not Currently   Sexual activity: Not Currently  Other Topics Concern   Not on file  Social History Narrative   **  Merged History Encounter **       Social Determinants of Health   Financial Resource Strain: Not on file  Food Insecurity: Not on file  Transportation Needs: Not on file  Physical Activity: Not on file  Stress: Not on file  Social Connections: Not on file     Family History:  The patient's ***family history includes Asthma in her father; COPD in her father; Cancer in her maternal grandmother and paternal grandmother; Diabetes in her maternal grandfather and paternal grandfather; Heart disease in her maternal grandfather and paternal grandfather; Hypertension in her father, maternal grandfather, maternal grandmother, paternal grandfather, and paternal grandmother; Stroke in her maternal grandfather, maternal grandmother, paternal  grandfather, and paternal grandmother.   Review of Systems:    Please see the history of present illness.     All other systems reviewed and are otherwise negative except as noted above.   Physical Exam:    VS:  There were no vitals taken for this visit.   General: Well developed, well nourished,female appearing in no acute distress. Head: Normocephalic, atraumatic. Neck: No carotid bruits. JVD not elevated.  Lungs: Respirations regular and unlabored, without wheezes or rales.  Heart: ***Regular rate and rhythm. No S3 or S4.  No murmur, no rubs, or gallops appreciated. Abdomen: Appears non-distended. No obvious abdominal masses. Msk:  Strength and tone appear normal for age. No obvious joint deformities or effusions. Extremities: No clubbing or cyanosis. No edema.  Distal pedal pulses are 2+ bilaterally. Neuro: Alert and oriented X 3. Moves all extremities spontaneously. No focal deficits noted. Psych:  Responds to questions appropriately with a normal affect. Skin: No rashes or lesions noted  Wt Readings from Last 3 Encounters:  05/19/21 170 lb (77.1 kg)  04/18/21 166 lb (75.3 kg)  03/21/21 170 lb (77.1 kg)        Studies/Labs Reviewed:   EKG:  EKG is*** ordered today.  The ekg ordered today demonstrates ***  Recent Labs: 03/07/2021: Hemoglobin 13.7; Platelets 345; TSH 1.670 04/18/2021: ALT 10 05/19/2021: BUN 11; Creatinine, Ser 0.74; Potassium 4.0; Sodium 135   Lipid Panel    Component Value Date/Time   CHOL 208 (H) 03/07/2021 1531   TRIG 124 03/07/2021 1531   HDL 48 03/07/2021 1531   CHOLHDL 4.3 03/07/2021 1531   LDLCALC 138 (H) 03/07/2021 1531    Additional studies/ records that were reviewed today include:   Event Monitor: 05/2021 4 episodes of SVT, longest lasting 19 beats     Patch Wear Time:  5 days and 0 hours (2023-01-06T12:23:31-0500 to 2023-01-11T12:34:00-0500)   Patient had a min HR of 41 bpm, max HR of 167 bpm, and avg HR of 66 bpm. Predominant  underlying rhythm was Sinus Rhythm. 4 Supraventricular Tachycardia runs occurred, the run with the fastest interval lasting 4 beats with a max rate of 167 bpm, the  longest lasting 19 beats with an avg rate of 118 bpm. Some episodes of Supraventricular Tachycardia may be possible Atrial Tachycardia with variable block. True duration of Supraventricular Tachycardia difficult to ascertain due to  artifact.Supraventricular Tachycardia was detected within +/- 45 seconds of symptomatic patient event(s). Isolated SVEs were rare (<1.0%), SVE Couplets were rare (<1.0%), and SVE Triplets were rare (<1.0%). Isolated VEs were rare (<1.0%), and no VE  Couplets or VE Triplets were present.  12 patient triggered events, corresponding to sinus rhythm  PACs, SVT  Assessment:    No diagnosis found.   Plan:   In order of problems listed above:  1. HTN - ***  2. Palpitations - ***    Shared Decision Making/Informed Consent:   {Are you ordering a CV Procedure (e.g. stress test, cath, DCCV, TEE, etc)?   Press F2        :[id number]}    Medication Adjustments/Labs and Tests Ordered: Current medicines are reviewed at length with the patient today.  Concerns regarding medicines are outlined above.  Medication changes, Labs and Tests ordered today are listed in the Patient Instructions below. There are no Patient Instructions on file for this visit.   Signed, Ellsworth Lennox, PA-C  07/05/2021 11:43 AM    [facility] [address] [place], [place] [address] Phone: [phone] Fax: [phone]

## 2021-07-06 ENCOUNTER — Ambulatory Visit: Payer: Managed Care, Other (non HMO) | Admitting: Student

## 2022-02-03 ENCOUNTER — Emergency Department (HOSPITAL_COMMUNITY): Payer: Self-pay

## 2022-02-03 ENCOUNTER — Emergency Department (HOSPITAL_COMMUNITY)
Admission: EM | Admit: 2022-02-03 | Discharge: 2022-02-04 | Disposition: A | Discharge status: Home / Self Care | Payer: Self-pay | Attending: Emergency Medicine | Admitting: Emergency Medicine

## 2022-02-03 ENCOUNTER — Encounter (HOSPITAL_COMMUNITY): Payer: Self-pay

## 2022-02-03 DIAGNOSIS — R002 Palpitations: Secondary | ICD-10-CM | POA: Insufficient documentation

## 2022-02-03 DIAGNOSIS — I10 Essential (primary) hypertension: Secondary | ICD-10-CM | POA: Insufficient documentation

## 2022-02-03 DIAGNOSIS — Z79899 Other long term (current) drug therapy: Secondary | ICD-10-CM | POA: Insufficient documentation

## 2022-02-03 DIAGNOSIS — N9489 Other specified conditions associated with female genital organs and menstrual cycle: Secondary | ICD-10-CM | POA: Insufficient documentation

## 2022-02-03 LAB — BASIC METABOLIC PANEL
Anion gap: 10 (ref 5–15)
BUN: 5 mg/dL — ABNORMAL LOW (ref 6–20)
CO2: 25 mmol/L (ref 22–32)
Calcium: 10.2 mg/dL (ref 8.9–10.3)
Chloride: 105 mmol/L (ref 98–111)
Creatinine, Ser: 0.85 mg/dL (ref 0.44–1.00)
GFR, Estimated: >60 mL/min (ref >60)
Glucose, Bld: 96 mg/dL (ref 70–99)
Potassium: 3.5 mmol/L (ref 3.5–5.1)
Sodium: 140 mmol/L (ref 135–145)

## 2022-02-03 LAB — CBC
HCT: 34.5 % — ABNORMAL LOW (ref 36.0–46.0)
Hemoglobin: 10.9 g/dL — ABNORMAL LOW (ref 12.0–15.0)
MCH: 24.6 pg — ABNORMAL LOW (ref 26.0–34.0)
MCHC: 31.6 g/dL (ref 30.0–36.0)
MCV: 77.9 fL — ABNORMAL LOW (ref 80.0–100.0)
Platelets: 414 10*3/uL — ABNORMAL HIGH (ref 150–400)
RBC: 4.43 MIL/uL (ref 3.87–5.11)
RDW: 18 % — ABNORMAL HIGH (ref 11.5–15.5)
WBC: 9.8 10*3/uL (ref 4.0–10.5)
nRBC: 0 % (ref 0.0–0.2)

## 2022-02-03 LAB — TROPONIN I (HIGH SENSITIVITY): Troponin I (High Sensitivity): 4 ng/L (ref <18)

## 2022-02-03 NOTE — ED Triage Notes (Signed)
Pt states that she has CP all the time and today it has been worse, dizziness and SOB and nausea. Pt states she has a hx of runs of SVT, very hypertensive in triage, systolic 732

## 2022-02-04 LAB — I-STAT BETA HCG BLOOD, ED (MC, WL, AP ONLY): I-stat hCG, quantitative: <5 m[IU]/mL (ref <5)

## 2022-02-04 LAB — TROPONIN I (HIGH SENSITIVITY): Troponin I (High Sensitivity): 4 ng/L (ref <18)

## 2022-02-04 MED ORDER — PROPRANOLOL HCL 10 MG PO TABS
10.0000 mg | ORAL_TABLET | Freq: Once | ORAL | Status: AC
  Administered 2022-02-04: 10 mg via ORAL
  Filled 2022-02-04: qty 1

## 2022-02-04 NOTE — ED Notes (Signed)
Patient walked of room stating "I'm not waiting any more" and left. No s/s of any distress/ ambulatory. MD made aware

## 2022-02-04 NOTE — ED Provider Notes (Signed)
[facility] EMERGENCY DEPARTMENT Provider Note   CSN: [id number] Arrival date & time: 02/03/22  2230     History  Chief Complaint  Patient presents with   Chest Pain    Marissa Serrano is a 41 y.o. female.  41 year old female with a history of hard to treat hypertension be worked up by cardiology and also palpitations that recently found out to be SVT the presents the ER today with increased frequency of palpitations.  Patient states that sometimes awaken her from sleep and sometimes will happen at work we will strive just an acute onset of palpitations or feel lightheaded little short of breath.  Usually last only a few seconds.  She had a monitor that she had for a week in May that showed episodes of SVT lasting up to 19 bpm with an average rate of 120 but as high as 168.  She saw her primary provider afterwards who did not make any adjustments.  She does not feel that these are related to anxiety or panic attacks.  On review the record she has had metanephrines and steroid level checked in the past and these were normal.  She has a renal ultrasound ordered but has not been performed yet.  She states compliance with her medications.  She does not take any illicit drugs but does smoke half a pack of cigarettes a day.  No alcohol use.  No change in her diet. Has a h/o fibroids s/p surgery and is now having worsening bleeding again, she states the next step is a hysterectomy.    Chest Pain      Home Medications Prior to Admission medications   Medication Sig Start Date End Date Taking? Authorizing Provider  amLODipine (NORVASC) 10 MG tablet Take 1 tablet (10 mg total) by mouth at bedtime. 04/18/21   Baruch Gouty, FNP  cloNIDine (CATAPRES) 0.1 MG tablet Take 1 tablet (0.1 mg total) by mouth 3 (three) times daily as needed. Three times daily as needed for BP over 150/90 03/07/21   Baruch Gouty, FNP  FLUoxetine (PROZAC) 20 MG capsule Take 1 capsule (20 mg total) by  mouth daily. 03/07/21   Baruch Gouty, FNP  hydrochlorothiazide (HYDRODIURIL) 25 MG tablet Take 1 tablet (25 mg total) by mouth daily. 04/18/21   Baruch Gouty, FNP  lisinopril (ZESTRIL) 40 MG tablet Take 1 tablet (40 mg total) by mouth daily. 04/18/21   Baruch Gouty, FNP  spironolactone (ALDACTONE) 25 MG tablet Take 1 tablet (25 mg total) by mouth daily. 05/19/21 08/17/21  Donato Heinz, MD  SUMAtriptan (IMITREX) 100 MG tablet One tablet at onset of headache. May repeat x 1 in 2 hours if no relief. Maximum of 200 mg in 24 hours. 05/08/11   Charlann Lange, PA-C      Allergies    Patient has no known allergies.    Review of Systems   Review of Systems  Cardiovascular:  Positive for chest pain.    Physical Exam Updated Vital Signs BP (!) 168/103   Pulse (!) 56   Temp 98.5 F (36.9 C) (Oral)   Resp 20   SpO2 98%  Physical Exam Vitals and nursing note reviewed.  Constitutional:      Appearance: She is well-developed.  HENT:     Head: Normocephalic and atraumatic.  Cardiovascular:     Rate and Rhythm: Normal rate and regular rhythm.     Comments: Hypertensive Equal and bounding radial and DP pulses  Pulmonary:     Effort: No respiratory distress.     Breath sounds: No stridor.  Abdominal:     General: There is no distension.     Palpations: Abdomen is soft.  Musculoskeletal:        General: Normal range of motion.     Cervical back: Normal range of motion.  Skin:    General: Skin is warm and dry.  Neurological:     Mental Status: She is alert.     ED Results / Procedures / Treatments   Labs (all labs ordered are listed, but only abnormal results are displayed) Labs Reviewed  BASIC METABOLIC PANEL - Abnormal; Notable for the following components:      Result Value   BUN 5 (*)    All other components within normal limits  CBC - Abnormal; Notable for the following components:   Hemoglobin 10.9 (*)    HCT 34.5 (*)    MCV 77.9 (*)    MCH 24.6 (*)    RDW 18.0  (*)    Platelets 414 (*)    All other components within normal limits  I-STAT BETA HCG BLOOD, ED ([facility], [facility], [facility] ONLY)  TROPONIN I (HIGH SENSITIVITY)  TROPONIN I (HIGH SENSITIVITY)    EKG None  Radiology DG Chest 2 View  Result Date: 02/03/2022 CLINICAL DATA:  Chest pain EXAM: CHEST - 2 VIEW COMPARISON:  Radiographs 03/17/2019 FINDINGS: No focal consolidation, pleural effusion, or pneumothorax. Normal cardiomediastinal silhouette. No acute osseous abnormality. IMPRESSION: No active cardiopulmonary disease. Electronically Signed   By: Placido Sou M.D.   On: 02/03/2022 23:11    Procedures Procedures    Medications Ordered in ED Medications  propranolol (INDERAL) tablet 10 mg (10 mg Oral Given 02/04/22 0212)    ED Course/ Medical Decision Making/ A&P                           Medical Decision Making Amount and/or Complexity of Data Reviewed Labs: ordered. Radiology: ordered.  Risk Prescription drug management.   Suspect her episodes are related to SVT as it shows on her monitor that were also symptomatic events.  She states they are becoming more frequent.  Her blood pressure still high here.  I think propranolol could help her quite a bit.  I considered possible blood loss anemia however her hemoglobin although slightly low is not to the point should be symptomatic at this point.  She is already had multiple test done to evaluate for hyperaldosteronism and pheochromocytoma.  I encouraged her to get her renal ultrasound done to further rule out the more rare causes.  Also encouraged her to quit smoking.  BP improved with propranolol, went back to re-evaluate and go over d/c plan and was told by nurse that patient eloped.        Final Clinical Impression(s) / ED Diagnoses Final diagnoses:  Palpitations    Rx / DC Orders ED Discharge Orders     None         Lanyia Jewel, Corene Cornea, MD 02/04/22 [phone]

## 2023-01-11 ENCOUNTER — Encounter (HOSPITAL_COMMUNITY): Payer: Self-pay | Admitting: Emergency Medicine

## 2023-01-11 ENCOUNTER — Other Ambulatory Visit: Payer: Self-pay

## 2023-01-11 ENCOUNTER — Emergency Department (HOSPITAL_COMMUNITY)
Admission: EM | Admit: 2023-01-11 | Discharge: 2023-01-11 | Disposition: A | Discharge status: Home / Self Care | Payer: Self-pay | Attending: Emergency Medicine | Admitting: Emergency Medicine

## 2023-01-11 DIAGNOSIS — Z79899 Other long term (current) drug therapy: Secondary | ICD-10-CM | POA: Insufficient documentation

## 2023-01-11 DIAGNOSIS — I10 Essential (primary) hypertension: Secondary | ICD-10-CM | POA: Insufficient documentation

## 2023-01-11 DIAGNOSIS — R519 Headache, unspecified: Secondary | ICD-10-CM

## 2023-01-11 LAB — CBC WITH DIFFERENTIAL/PLATELET
Abs Immature Granulocytes: 0.03 10*3/uL (ref 0.00–0.07)
Basophils Absolute: 0 10*3/uL (ref 0.0–0.1)
Basophils Relative: 0 %
Eosinophils Absolute: 0.1 10*3/uL (ref 0.0–0.5)
Eosinophils Relative: 1 %
HCT: 37.8 % (ref 36.0–46.0)
Hemoglobin: 11.9 g/dL — ABNORMAL LOW (ref 12.0–15.0)
Immature Granulocytes: 0 %
Lymphocytes Relative: 19 %
Lymphs Abs: 1.4 10*3/uL (ref 0.7–4.0)
MCH: 24 pg — ABNORMAL LOW (ref 26.0–34.0)
MCHC: 31.5 g/dL (ref 30.0–36.0)
MCV: 76.4 fL — ABNORMAL LOW (ref 80.0–100.0)
Monocytes Absolute: 0.4 10*3/uL (ref 0.1–1.0)
Monocytes Relative: 6 %
Neutro Abs: 5.5 10*3/uL (ref 1.7–7.7)
Neutrophils Relative %: 74 %
Platelets: 389 10*3/uL (ref 150–400)
RBC: 4.95 MIL/uL (ref 3.87–5.11)
RDW: 17 % — ABNORMAL HIGH (ref 11.5–15.5)
WBC: 7.4 10*3/uL (ref 4.0–10.5)
nRBC: 0 % (ref 0.0–0.2)

## 2023-01-11 LAB — BASIC METABOLIC PANEL
Anion gap: 7 (ref 5–15)
BUN: 9 mg/dL (ref 6–20)
CO2: 23 mmol/L (ref 22–32)
Calcium: 8.7 mg/dL — ABNORMAL LOW (ref 8.9–10.3)
Chloride: 103 mmol/L (ref 98–111)
Creatinine, Ser: 0.86 mg/dL (ref 0.44–1.00)
GFR, Estimated: >60 mL/min (ref >60)
Glucose, Bld: 92 mg/dL (ref 70–99)
Potassium: 4.7 mmol/L (ref 3.5–5.1)
Sodium: 133 mmol/L — ABNORMAL LOW (ref 135–145)

## 2023-01-11 MED ORDER — LACTATED RINGERS IV BOLUS: 1000.0000 mL | Freq: Once | INTRAVENOUS | Status: DC

## 2023-01-11 MED ORDER — METOCLOPRAMIDE HCL 5 MG/ML IJ SOLN
10.0000 mg | Freq: Once | INTRAMUSCULAR | Status: DC
  Filled 2023-01-11: qty 2

## 2023-01-11 MED ORDER — BUTALBITAL-APAP-CAFFEINE 50-325-40 MG PO TABS
1.0000 | ORAL_TABLET | Freq: Four times a day (QID) | ORAL | 0 refills | Status: AC | PRN
Start: 2023-01-11 — End: ?

## 2023-01-11 MED ORDER — KETOROLAC TROMETHAMINE 15 MG/ML IJ SOLN
15.0000 mg | Freq: Once | INTRAMUSCULAR | Status: DC
  Filled 2023-01-11: qty 1

## 2023-01-11 MED ORDER — DIPHENHYDRAMINE HCL 50 MG/ML IJ SOLN
25.0000 mg | Freq: Once | INTRAMUSCULAR | Status: DC
  Filled 2023-01-11: qty 1

## 2023-01-11 MED ORDER — KETOROLAC TROMETHAMINE 10 MG PO TABS
10.0000 mg | ORAL_TABLET | Freq: Once | ORAL | Status: AC
  Administered 2023-01-11: 10 mg via ORAL
  Filled 2023-01-11: qty 1

## 2023-01-11 MED ORDER — MAGNESIUM SULFATE IN D5W 1-5 GM/100ML-% IV SOLN
1.0000 g | Freq: Once | INTRAVENOUS | Status: DC
  Filled 2023-01-11: qty 100

## 2023-01-11 MED ORDER — BUTALBITAL-APAP-CAFFEINE 50-325-40 MG PO TABS
1.0000 | ORAL_TABLET | Freq: Once | ORAL | Status: AC
  Administered 2023-01-11: 1 via ORAL
  Filled 2023-01-11: qty 1

## 2023-01-11 NOTE — ED Triage Notes (Signed)
Pt c/o migraine since Monday with elevated bp. States she has been taking her lisinopril 20mg  po daily with no relief.

## 2023-01-11 NOTE — ED Provider Notes (Signed)
[place] EMERGENCY DEPARTMENT AT [facility] Provider Note   CSN: [id number] Arrival date & time: 01/11/23  4098     History  Chief Complaint  Patient presents with   Headache    Marissa Serrano is a 42 y.o. female.  He has PMH of hypertension, states blood pressure is chronically high, also has history of chronic migraines.  States she has had this particular migraine that she presents for today for the past 5 days, it switches between the right and left side of her head, this is common for her, no fevers or chills.  She has been taking Excedrin Migraine at home without relief.  She occasionally has nausea, also having some sensitivity to light.  She has had to miss work due to the headache.  Notes her diastolic numbers are somewhat higher than usual despite taking her medicines.  She states her blood pressure does chronically run high and has never normal but the bottle was somewhat higher in with the headache she would have to be evaluated.  No numbness tingling or weakness.  No chest pain, no shortness of breath. He has no neck pain or stiffness, no fevers or chills.  No recent trauma   Headache      Home Medications Prior to Admission medications   Medication Sig Start Date End Date Taking? Authorizing Provider  butalbital-acetaminophen-caffeine (FIORICET) 50-325-40 MG tablet Take 1 tablet by mouth every 6 (six) hours as needed for headache. 01/11/23  Yes Cristi Loron, Josselin Gaulin A, PA-C  amLODipine (NORVASC) 10 MG tablet Take 1 tablet (10 mg total) by mouth at bedtime. 04/18/21   Sonny Masters, FNP  cloNIDine (CATAPRES) 0.1 MG tablet Take 1 tablet (0.1 mg total) by mouth 3 (three) times daily as needed. Three times daily as needed for BP over 150/90 03/07/21   Sonny Masters, FNP  FLUoxetine (PROZAC) 20 MG capsule Take 1 capsule (20 mg total) by mouth daily. 03/07/21   Sonny Masters, FNP  hydrochlorothiazide (HYDRODIURIL) 25 MG tablet Take 1 tablet (25 mg total) by mouth  daily. 04/18/21   Sonny Masters, FNP  lisinopril (ZESTRIL) 40 MG tablet Take 1 tablet (40 mg total) by mouth daily. 04/18/21   Sonny Masters, FNP  spironolactone (ALDACTONE) 25 MG tablet Take 1 tablet (25 mg total) by mouth daily. 05/19/21 08/17/21  Little Ishikawa, MD  SUMAtriptan (IMITREX) 100 MG tablet One tablet at onset of headache. May repeat x 1 in 2 hours if no relief. Maximum of 200 mg in 24 hours. 05/08/11   Elpidio Anis, PA-C      Allergies    Shellfish allergy    Review of Systems   Review of Systems  Neurological:  Positive for headaches.    Physical Exam Updated Vital Signs BP (!) 185/114   Pulse 61   Temp 99.1 F (37.3 C) (Oral)   Resp 18   Ht 5\' 4"  (1.626 m)   Wt 65.8 kg   LMP 01/10/2023   SpO2 100%   BMI 24.89 kg/m  Physical Exam Vitals and nursing note reviewed.  Constitutional:      General: She is not in acute distress.    Appearance: She is well-developed.  HENT:     Head: Normocephalic and atraumatic.  Eyes:     General: No scleral icterus.    Extraocular Movements: Extraocular movements intact.     Conjunctiva/sclera: Conjunctivae normal.     Pupils: Pupils are equal, round, and reactive to light.  Cardiovascular:     Rate and Rhythm: Normal rate and regular rhythm.     Heart sounds: No murmur heard. Pulmonary:     Effort: Pulmonary effort is normal. No respiratory distress.     Breath sounds: Normal breath sounds.  Abdominal:     Palpations: Abdomen is soft.     Tenderness: There is no abdominal tenderness.  Musculoskeletal:        General: No swelling.     Cervical back: Normal range of motion and neck supple.  Skin:    General: Skin is warm and dry.     Capillary Refill: Capillary refill takes less than 2 seconds.  Neurological:     Mental Status: She is alert and oriented to person, place, and time.     GCS: GCS eye subscore is 4. GCS verbal subscore is 5. GCS motor subscore is 6.     Cranial Nerves: No cranial nerve deficit or  facial asymmetry.  Psychiatric:        Mood and Affect: Mood normal.        Speech: Speech normal.        Behavior: Behavior normal.     ED Results / Procedures / Treatments   Labs (all labs ordered are listed, but only abnormal results are displayed) Labs Reviewed  BASIC METABOLIC PANEL - Abnormal; Notable for the following components:      Result Value   Sodium 133 (*)    Calcium 8.7 (*)    All other components within normal limits  CBC WITH DIFFERENTIAL/PLATELET - Abnormal; Notable for the following components:   Hemoglobin 11.9 (*)    MCV 76.4 (*)    MCH 24.0 (*)    RDW 17.0 (*)    All other components within normal limits  PREGNANCY, URINE    EKG None  Radiology No results found.  Procedures Procedures    Medications Ordered in ED Medications  ketorolac (TORADOL) tablet 10 mg (10 mg Oral Given 01/11/23 1521)  butalbital-acetaminophen-caffeine (FIORICET) 50-325-40 MG per tablet 1 tablet (1 tablet Oral Given 01/11/23 1521)    ED Course/ Medical Decision Making/ A&P                                 Medical Decision Making DDx: migraine, tension headache, Intracranial hemorrhage, cluster headache, medication overuse headache, intracranial mass, temporal arteritis, other Course: Patient with history of hypertension and migraines presents with migraine for several days not improved with home meds.  Initial plan was to recheck blood pressure every other pain or control.  States blood pressure is always this high, she has no numbness tingling weakness, no chest pain or shortness of breath, no change in her chronic headaches. Plan was for IV migraine cocktail, fluids, magnesium.  Patient was struck twice for IV and not successful.  Offered ultrasound IV the patient declined, she would prefer p.o. meds and to go home.  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  Is following up already for her resistant hypertension has had full workup including for pheochromocytoma and renal artery stenosis which was negative.  She has been compliant with her medications.  Is going to follow-up with her doctor again since blood pressure does not seem to have changed.  She had been on clonidine previously but  states she did likely make her feel and she could not work while taking it.   Amount and/or Complexity of Data Reviewed Labs: ordered.  Risk Prescription drug management.           Final Clinical Impression(s) / ED Diagnoses Final diagnoses:  Acute nonintractable headache, unspecified headache type  Hypertension, unspecified type    Rx / DC Orders ED Discharge Orders          Ordered    butalbital-acetaminophen-caffeine (FIORICET) 50-325-40 MG tablet  Every 6 hours PRN        01/11/23 1520              Ma Rings, PA-C 01/11/23 1524    Rondel Baton, MD 01/11/23 [phone]

## 2023-01-11 NOTE — Discharge Instructions (Addendum)
It was a pleasure taking care of you today.  You were seen for headache.  We gave you medicine here, you were offered IV medicine but declined.  You are given a work note at your request as well for today and tomorrow.  You need to follow-up with your doctor for a blood pressure recheck.  It appears that your blood pressure is where it normally is.  It is probably slightly higher today due to your headache.  Drink plenty of fluids.  He developed numbness or tingling or weakness, chest pain or shortness of breath or any other worrisome changes come back to the ER right away.
